# Patient Record
Sex: Male | Born: 1980 | Race: Black or African American | Hispanic: No | Marital: Single | Smoking: Former smoker
Health system: Southern US, Community
[De-identification: ages and names within clinical notes are randomized; demographics above are authoritative.]

## PROBLEM LIST (undated history)

## (undated) DIAGNOSIS — J3089 Other allergic rhinitis: Secondary | ICD-10-CM

## (undated) DIAGNOSIS — J45909 Unspecified asthma, uncomplicated: Secondary | ICD-10-CM

## (undated) DIAGNOSIS — J40 Bronchitis, not specified as acute or chronic: Secondary | ICD-10-CM

---

## 2007-08-04 ENCOUNTER — Emergency Department (HOSPITAL_COMMUNITY): Admission: EM | Admit: 2007-08-04 | Discharge: 2007-08-05 | Payer: Self-pay | Admitting: Emergency Medicine

## 2008-02-01 ENCOUNTER — Inpatient Hospital Stay (HOSPITAL_COMMUNITY): Admission: EM | Admit: 2008-02-01 | Discharge: 2008-02-04 | Payer: Self-pay | Admitting: Emergency Medicine

## 2009-01-06 ENCOUNTER — Emergency Department (HOSPITAL_BASED_OUTPATIENT_CLINIC_OR_DEPARTMENT_OTHER): Admission: EM | Admit: 2009-01-06 | Discharge: 2009-01-06 | Payer: Self-pay | Admitting: Emergency Medicine

## 2009-01-06 ENCOUNTER — Ambulatory Visit: Payer: Self-pay | Admitting: Diagnostic Radiology

## 2011-02-25 NOTE — H&P (Signed)
NAMEAUDWIN, SEMPER NO.:  [id number]   MEDICAL RECORD NO.:  [id number]          PATIENT TYPE:  INP   LOCATION:  0103                         FACILITY:  [facility]   PHYSICIAN:  Lucita Ferrara, MD         DATE OF BIRTH:  09/27/1981   DATE OF ADMISSION:  01/31/2008  DATE OF DISCHARGE:                              HISTORY & PHYSICAL   The patient is a 30 year old male with known childhood asthma who  presents to [facility] with chief complaint of  cough, wheezing, and shortness of breath that has been ongoing now for  three days.  The patient states that current symptoms did not respond to  his routine meter dose inhaler- albuterol.  Note, that he does not have  a primary care doctor and also he is not on any controller medications.  He has never been intubated and never seen a pulmonologist in regards to  his asthma.  He denies a history of rash or allergies.  Otherwise, he  denies any fevers or chills.  Cough is nonproductive.  He does smoke  less than 1/2 pack per day.  He also smokes marijuana.  He denies any  other drugs besides marijuana.   ALLERGIES:  No known drug allergies.   HOME MEDICATIONS:  Albuterol meter dose inhaler.   REVIEW OF SYSTEMS:  Otherwise negative.   FAMILY HISTORY:  Significant for diabetes.  Otherwise noncontributory.   PAST SURGICAL HISTORY:  None.   PHYSICAL EXAMINATION:  GENERAL:  The patient is in no acute respiratory  distress upon rest while lying in bed, but upon ambulation the patient  drops his pulse oximetry below 90%.  VITAL SIGNS:  Temperature 101, blood pressure 115/63, pulse 114,  respirations 22, pulse oximetry 85% on ambulation and 92% on 2 liters  nasal cannula.  HEENT:  Normocephalic and atraumatic.  Sclerae anicteric.  NECK:  Supple.  No JVD and no carotid bruits.  PERRLA.  Extraocular  muscles intact.  CARDIOVASCULAR:  S1 and S2 tachy.  Regular rhythm.  No murmurs, rubs, or  clicks.  ABDOMEN:   Soft, nontender, and nondistended.  Positive bowel sounds.  LUNGS:  Bilateral expiratory wheezes in all lung fields.  No rhonchi or  rales.  NEUROLOGY:  The patient is alert and oriented x3.  Cranial nerves II-XII  grossly intact.  EXTREMITIES:  No cyanosis, clubbing, or edema.   Chest x-ray shows mild peribronchial thickening without any focal air  space disease.   LABORATORY DATA:  White count 13.1, hemoglobin 16.3, hematocrit 47.4,  neutrophils 95.  Sodium 138, potassium 3.9, chloride 101, BUN 10,  creatinine 1.07, glucose 167.   ASSESSMENT:  A 30 year old with known childhood asthma, an active  smoker, here with cough/wheeze/shortness of breath not relieved with  outpatient meter dose inhaler or emergency room treatment with  prednisone and high flow nebulizers.  The patient is still dropping his  pulse oximetry upon ambulation to 88%.  The patient also has no PMD and  is currently on no controller medications.  He is an active smoker.   PLAN:  We will go ahead and admit  the patient to the medical telemetry  unit.  Start Solu-Medrol 80 mg IV q.8 hours followed by prednisone  taper.  Given that the patient also had mild fevers and mild  leukocytosis, we will empirically treat the patient with Levaquin 500 mg  IV daily, sputum cultures.   We will initiate controller medications with Flovent 88 mcg daily and  also Singulair 10 mg p.o. q.h.s.  Smoking cessation will be heavily  emphasized.  The patient will be given a nicotine patch.  We will  emphasize that smoking marijuana may also worsen asthma exacerbation.   Care management needs to set up PMD and perhaps referral to Health  Service.   Plans and procedures of this admission explained to the patient.  The  patient understands.  Time spend >45 minutes.      Lucita Ferrara, MD  Electronically Signed     RR/MEDQ  D:  02/01/2008  T:  02/01/2008  Job:  [id number]

## 2011-02-25 NOTE — Discharge Summary (Signed)
[name], [name]       ACCOUNT NO.:  [id number]   MEDICAL RECORD NO.:  [id number]          PATIENT TYPE:  INP   LOCATION:  1437                         FACILITY:  [facility]   PHYSICIAN:  Herbie Saxon, MDDATE OF BIRTH:  03/03/1981   DATE OF ADMISSION:  01/31/2008  DATE OF DISCHARGE:  02/04/2008                               DISCHARGE SUMMARY   h/ISCHARGE DIAGNOSES/>  1. Bronchial asthma exacerbation, improved,  2. Tachycardia, resolved  3. Chronic bronchitis, improved.  4. Gram-negative sepsis.  5. Positive blood culture also showing gram-positive cocci.  6. Substance abuse, tobacco and marijuana.  7. Conjunctivitis, improved.   RADIOLOGY:  Chest x-ray of January 31, 2008, shows mild peribronchial  thickening without focal airspace disease.   HOSPITAL COURSE:  This 30 year old African-American male with h/o  b/asthma, presented with increasing cough, wheezing, and shortness of  breath of 3 days' duration.  The patient smokes a pack per day, also  smokes marijuana occasionally.  The patient on this admission was  started on albuterol inhaler, also started on Flovent with Levaquin 500  mg IV daily.  His sputum culture was positive for polymorphic nuclear  cells, gram-positive cocci, and gram-negative rods.  The patient was  clinically improved on bronchodilators, oxygen supplementally, and he  had TobraDex eye drops added to his medication regimen as well as  noticing to be having bilateral redness of the eyes.  This is also  clinically improved.  The patient has been counseled extensively on the  need to abstain from smoking either tobacco or marijuana, as this would  exacerbate his lung symptoms.  Leukocytosis is resolved.  Note the  patient had vancomycin added to his medications empirically.  His trach  condition is stable.   DIET:  Regular.   ACTIVITY:  No restrictions, increase as tolerated.   FOLLOW UP:  At the office of primary care physician in next 5-7  days.   MEDICATIONS ON DISCHARGE:  1. Levaquin 72 mg daily x5 days.  2. Doxycycline 100 mg b.i.d. x5 days.  3. TobraDex 2 drops q.6h. bilateral eyes.  4. Prednisone 30 mg daily x4 days, 20 mg daily x4 days, 10 mg daily x4      days, 5 mg daily x2 days.  5. Albuterol inhaler 2 puffs q.6h. p.r.n.  6. Singulair 10 mg daily.  7. Advair 250/50, 2 puffs b.i.d.   EXAMINATION:  He is a young man not in acute distress.  Temperature 98, pulse 80, respiratory 20, blood pressure 106/63.  Pupils equal, reactive to light and accommodation.  Mucous membranes are  moist.  Oropharynx clear.  NECK:  Supple.  There is no mandibular lymphadenopathy.  No elevated JVD  or carotid bruit.  HEART:  Heartsounds 1 and 2 regular rate and rhythm, a few bilateral  expiratory rhonchi.  ABDOMEN:  Soft, nontender, no organomegaly.  Inguinal orifices are open.  NEURO:  Cranial nerves 2-12 intact.  EXTREMITIES:  Peripheral pulses present, no pedal edema.   LABS:  The glucose is 96.  WBC is 9; hematocrit is 42; platelet count is  193.  Chemistries:  Sodium 135, potassium 4.12, chloride 100,  bicarbonate  26, BUN 11, creatinine 1.1.  Sputum culture of February 02, 2008:  Few WBC, few gram-negative rods.   Need for followup and compliance with medication explained to the  patient.  Social work has also been involved as he has financial  difficulties in getting his medication.  Discharge time 30 minutes.      Herbie Saxon, MD  Electronically Signed     MIO/MEDQ  D:  02/04/2008  T:  02/04/2008  Job:  [id number]

## 2011-07-08 LAB — CBC
HCT: 42.5
HCT: 45.4
Hemoglobin: 14.4
Hemoglobin: 15.7
MCHC: 34.1
MCV: 92.4
MCV: 92.7
RBC: 4.59
RBC: 4.59
RBC: 4.69
RBC: 4.98
RBC: 5.13
RDW: 12.9
WBC: 12.5 — ABNORMAL HIGH
WBC: 13.1 — ABNORMAL HIGH
WBC: 14.3 — ABNORMAL HIGH
WBC: 9.5

## 2011-07-08 LAB — BASIC METABOLIC PANEL
Calcium: 9.7
Chloride: 101
Creatinine, Ser: 1.07
GFR calc Af Amer: >60
Sodium: 138

## 2011-07-08 LAB — DIFFERENTIAL
Eosinophils Absolute: 0.1
Eosinophils Relative: 1
Lymphs Abs: 0.4 — ABNORMAL LOW
Lymphs Abs: 2.2
Monocytes Absolute: 0.5
Monocytes Relative: 1 — ABNORMAL LOW
Monocytes Relative: 6
Neutro Abs: 12.5 — ABNORMAL HIGH
Neutrophils Relative %: 95 — ABNORMAL HIGH

## 2011-07-08 LAB — RAPID URINE DRUG SCREEN, HOSP PERFORMED
Opiates: POSITIVE — AB
Tetrahydrocannabinol: NOT DETECTED

## 2011-07-08 LAB — COMPREHENSIVE METABOLIC PANEL
Albumin: 4
Alkaline Phosphatase: 64
BUN: 11
Chloride: 100
Glucose, Bld: 229 — ABNORMAL HIGH
Potassium: 4.1
Total Bilirubin: 0.8

## 2011-07-08 LAB — CULTURE, RESPIRATORY W GRAM STAIN: Culture: NORMAL

## 2011-07-08 LAB — PHOSPHORUS: Phosphorus: 2.5

## 2017-08-10 ENCOUNTER — Emergency Department (HOSPITAL_COMMUNITY): Payer: Self-pay

## 2017-08-10 ENCOUNTER — Encounter (HOSPITAL_COMMUNITY): Payer: Self-pay

## 2017-08-10 ENCOUNTER — Emergency Department (HOSPITAL_COMMUNITY)
Admission: EM | Admit: 2017-08-10 | Discharge: 2017-08-10 | Disposition: A | Discharge status: Home / Self Care | Payer: Self-pay | Attending: Emergency Medicine | Admitting: Emergency Medicine

## 2017-08-10 DIAGNOSIS — J45909 Unspecified asthma, uncomplicated: Secondary | ICD-10-CM | POA: Insufficient documentation

## 2017-08-10 DIAGNOSIS — M25511 Pain in right shoulder: Secondary | ICD-10-CM | POA: Insufficient documentation

## 2017-08-10 DIAGNOSIS — F17200 Nicotine dependence, unspecified, uncomplicated: Secondary | ICD-10-CM | POA: Insufficient documentation

## 2017-08-10 HISTORY — DX: Unspecified asthma, uncomplicated: J45.909

## 2017-08-10 HISTORY — DX: Bronchitis, not specified as acute or chronic: J40

## 2017-08-10 MED ORDER — NAPROXEN 500 MG PO TABS
500.0000 mg | ORAL_TABLET | Freq: Two times a day (BID) | ORAL | 0 refills | Status: DC
Start: 2017-08-10 — End: 2017-10-09
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
## 2017-08-10 NOTE — Discharge Instructions (Signed)
You have been seen today for a shoulder injury. There were no acute abnormalities on the x-rays, including no sign of fracture or dislocation. Pain: Take 600 mg of ibuprofen every 6 hours or 440 mg (over the counter dose) to 500 mg (prescription dose) of naproxen every 12 hours for the next 3 days. After this time, these medications may be used as needed for pain. Take these medications with food to avoid upset stomach. Choose only one of these medications, do not take them together.  Tylenol: Should you continue to have additional pain while taking the ibuprofen or naproxen, you may add in tylenol as needed. Your daily total maximum amount of tylenol from all sources should be limited to 4000mg /day for persons without liver problems, or 2000mg /day for those with liver problems. Ice: May apply ice to the area over the next 24 hours for 15 minutes at a time to reduce swelling. Elevation: Keep the extremity elevated as often as possible to reduce pain and inflammation. Exercises: Start by performing these exercises a few times a week, increasing the frequency until you are performing them twice daily.  Follow up: If symptoms are improving, you may follow up with your primary care provider for any continued management. If symptoms are not improving, you may follow up with the orthopedic specialist.

## 2017-10-09 ENCOUNTER — Emergency Department (HOSPITAL_BASED_OUTPATIENT_CLINIC_OR_DEPARTMENT_OTHER)
Admission: EM | Admit: 2017-10-09 | Discharge: 2017-10-09 | Disposition: A | Discharge status: Home / Self Care | Payer: Self-pay | Attending: Emergency Medicine | Admitting: Emergency Medicine

## 2017-10-09 ENCOUNTER — Encounter (HOSPITAL_COMMUNITY): Payer: Self-pay | Admitting: Emergency Medicine

## 2017-10-09 ENCOUNTER — Other Ambulatory Visit: Payer: Self-pay

## 2017-10-09 ENCOUNTER — Emergency Department (HOSPITAL_COMMUNITY)
Admission: EM | Admit: 2017-10-09 | Discharge: 2017-10-09 | Disposition: A | Discharge status: Home / Self Care | Payer: Self-pay | Attending: Emergency Medicine | Admitting: Emergency Medicine

## 2017-10-09 ENCOUNTER — Emergency Department (HOSPITAL_COMMUNITY): Payer: Self-pay

## 2017-10-09 ENCOUNTER — Encounter (HOSPITAL_BASED_OUTPATIENT_CLINIC_OR_DEPARTMENT_OTHER): Payer: Self-pay | Admitting: Emergency Medicine

## 2017-10-09 DIAGNOSIS — R05 Cough: Secondary | ICD-10-CM | POA: Insufficient documentation

## 2017-10-09 DIAGNOSIS — R509 Fever, unspecified: Secondary | ICD-10-CM | POA: Insufficient documentation

## 2017-10-09 DIAGNOSIS — M791 Myalgia, unspecified site: Secondary | ICD-10-CM | POA: Insufficient documentation

## 2017-10-09 DIAGNOSIS — Z5321 Procedure and treatment not carried out due to patient leaving prior to being seen by health care provider: Secondary | ICD-10-CM | POA: Insufficient documentation

## 2017-10-09 LAB — COMPREHENSIVE METABOLIC PANEL
ALK PHOS: 62 U/L (ref 38–126)
ALT: 42 U/L (ref 17–63)
AST: 40 U/L (ref 15–41)
Albumin: 4.7 g/dL (ref 3.5–5.0)
Anion gap: 10 (ref 5–15)
BILIRUBIN TOTAL: 0.8 mg/dL (ref 0.3–1.2)
BUN: 13 mg/dL (ref 6–20)
CALCIUM: 9.8 mg/dL (ref 8.9–10.3)
CO2: 25 mmol/L (ref 22–32)
CREATININE: 1.27 mg/dL — AB (ref 0.61–1.24)
Chloride: 99 mmol/L — ABNORMAL LOW (ref 101–111)
Glucose, Bld: 101 mg/dL — ABNORMAL HIGH (ref 65–99)
Potassium: 3.6 mmol/L (ref 3.5–5.1)
Sodium: 134 mmol/L — ABNORMAL LOW (ref 135–145)
TOTAL PROTEIN: 8.1 g/dL (ref 6.5–8.1)

## 2017-10-09 LAB — PROTIME-INR
INR: 1.01
PROTHROMBIN TIME: 13.2 s (ref 11.4–15.2)

## 2017-10-09 LAB — CBC WITH DIFFERENTIAL/PLATELET
BASOS PCT: 0 %
Basophils Absolute: 0 10*3/uL (ref 0.0–0.1)
EOS ABS: 0 10*3/uL (ref 0.0–0.7)
Eosinophils Relative: 0 %
HCT: 49.7 % (ref 39.0–52.0)
HEMOGLOBIN: 17 g/dL (ref 13.0–17.0)
Lymphocytes Relative: 11 %
Lymphs Abs: 0.7 10*3/uL (ref 0.7–4.0)
MCH: 31.9 pg (ref 26.0–34.0)
MCHC: 34.2 g/dL (ref 30.0–36.0)
MCV: 93.2 fL (ref 78.0–100.0)
MONO ABS: 0.8 10*3/uL (ref 0.1–1.0)
MONOS PCT: 14 %
NEUTROS PCT: 75 %
Neutro Abs: 4.3 10*3/uL (ref 1.7–7.7)
PLATELETS: 172 10*3/uL (ref 150–400)
RBC: 5.33 MIL/uL (ref 4.22–5.81)
RDW: 12.7 % (ref 11.5–15.5)
WBC: 5.8 10*3/uL (ref 4.0–10.5)

## 2017-10-09 LAB — I-STAT CG4 LACTIC ACID, ED: Lactic Acid, Venous: 1.03 mmol/L (ref 0.5–1.9)

## 2017-10-09 MED ORDER — ACETAMINOPHEN 325 MG PO TABS: 650.0000 mg | ORAL_TABLET | Freq: Once | ORAL | Status: DC | PRN

## 2017-10-09 NOTE — ED Notes (Signed)
While triaging the patient the patient got a phone call and states that he has to leave because of an "emergency" - patient made aware that he is able to come back at any time. The patient advised that he has fever

## 2017-10-09 NOTE — ED Triage Notes (Signed)
Pt. Stated, Benjamin Atlasve got body aches, chills and a fever with a cough that started yesterday.

## 2017-10-09 NOTE — ED Triage Notes (Signed)
Patient states that when he coughs his chest and his head hurts. THe [facility]

## 2017-10-14 ENCOUNTER — Emergency Department (HOSPITAL_BASED_OUTPATIENT_CLINIC_OR_DEPARTMENT_OTHER): Payer: Self-pay

## 2017-10-14 ENCOUNTER — Encounter (HOSPITAL_BASED_OUTPATIENT_CLINIC_OR_DEPARTMENT_OTHER): Payer: Self-pay | Admitting: *Deleted

## 2017-10-14 ENCOUNTER — Emergency Department (HOSPITAL_BASED_OUTPATIENT_CLINIC_OR_DEPARTMENT_OTHER)
Admission: EM | Admit: 2017-10-14 | Discharge: 2017-10-14 | Disposition: A | Discharge status: Home / Self Care | Payer: Self-pay | Attending: Emergency Medicine | Admitting: Emergency Medicine

## 2017-10-14 ENCOUNTER — Other Ambulatory Visit: Payer: Self-pay

## 2017-10-14 DIAGNOSIS — J189 Pneumonia, unspecified organism: Secondary | ICD-10-CM | POA: Insufficient documentation

## 2017-10-14 DIAGNOSIS — F17210 Nicotine dependence, cigarettes, uncomplicated: Secondary | ICD-10-CM | POA: Insufficient documentation

## 2017-10-14 DIAGNOSIS — J45909 Unspecified asthma, uncomplicated: Secondary | ICD-10-CM | POA: Insufficient documentation

## 2017-10-14 LAB — CULTURE, BLOOD (ROUTINE X 2)
CULTURE: NO GROWTH
Culture: NO GROWTH
SPECIAL REQUESTS: ADEQUATE
Special Requests: ADEQUATE

## 2017-10-14 MED ORDER — BENZONATATE 100 MG PO CAPS: 100.0000 mg | ORAL_CAPSULE | Freq: Three times a day (TID) | ORAL | 0 refills | Status: DC

## 2017-10-14 MED ORDER — AZITHROMYCIN 250 MG PO TABS: ORAL_TABLET | ORAL | 0 refills | Status: DC

## 2017-10-14 MED ORDER — PREDNISONE 20 MG PO TABS: ORAL_TABLET | ORAL | 0 refills | Status: DC

## 2017-10-14 MED FILL — AZITHROMYCIN 250 MG TABLET: 250 | 5 days supply | Qty: 6 | Fill #0

## 2017-10-14 MED FILL — BENZONATATE 100 MG CAPSULE: 100 | 7 days supply | Qty: 21 | Fill #0

## 2017-10-14 MED FILL — predniSONE 20 MG TABS: 20 | 5 days supply | Qty: 11 | Fill #0

## 2017-10-14 NOTE — ED Triage Notes (Signed)
Pt reports one week of cough, congestion, checked in here on Sunday but couldn't wait to be seen due to other obligations. Cough and congestion continues today.

## 2017-10-14 NOTE — ED Notes (Signed)
Pt and family understood dc material. NAD noted. Scripts given at dc 
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
## 2017-10-14 NOTE — ED Triage Notes (Signed)
Pt states he is coughing up dark yellow sputum with "some little spots of blood sometimes."

## 2017-12-28 ENCOUNTER — Emergency Department (HOSPITAL_BASED_OUTPATIENT_CLINIC_OR_DEPARTMENT_OTHER): Payer: Self-pay

## 2017-12-28 ENCOUNTER — Other Ambulatory Visit: Payer: Self-pay

## 2017-12-28 ENCOUNTER — Emergency Department (HOSPITAL_BASED_OUTPATIENT_CLINIC_OR_DEPARTMENT_OTHER)
Admission: EM | Admit: 2017-12-28 | Discharge: 2017-12-28 | Disposition: A | Discharge status: Home / Self Care | Payer: Self-pay | Attending: Emergency Medicine | Admitting: Emergency Medicine

## 2017-12-28 ENCOUNTER — Encounter (HOSPITAL_BASED_OUTPATIENT_CLINIC_OR_DEPARTMENT_OTHER): Payer: Self-pay | Admitting: Emergency Medicine

## 2017-12-28 DIAGNOSIS — Z79899 Other long term (current) drug therapy: Secondary | ICD-10-CM | POA: Insufficient documentation

## 2017-12-28 DIAGNOSIS — F17200 Nicotine dependence, unspecified, uncomplicated: Secondary | ICD-10-CM | POA: Insufficient documentation

## 2017-12-28 DIAGNOSIS — R6889 Other general symptoms and signs: Secondary | ICD-10-CM

## 2017-12-28 DIAGNOSIS — J45909 Unspecified asthma, uncomplicated: Secondary | ICD-10-CM | POA: Insufficient documentation

## 2017-12-28 DIAGNOSIS — J111 Influenza due to unidentified influenza virus with other respiratory manifestations: Secondary | ICD-10-CM | POA: Insufficient documentation

## 2017-12-28 LAB — RAPID STREP SCREEN (MED CTR MEBANE ONLY): Streptococcus, Group A Screen (Direct): NEGATIVE

## 2017-12-28 MED ORDER — LIDOCAINE VISCOUS 2 % MT SOLN: 20.0000 mL | OROMUCOSAL | 0 refills | Status: AC | PRN

## 2017-12-28 MED ORDER — IPRATROPIUM-ALBUTEROL 0.5-2.5 (3) MG/3ML IN SOLN
3.0000 mL | Freq: Once | RESPIRATORY_TRACT | Status: AC
  Administered 2017-12-28: 3 mL via RESPIRATORY_TRACT
  Filled 2017-12-28: qty 3

## 2017-12-28 MED ORDER — BENZONATATE 100 MG PO CAPS: 100.0000 mg | ORAL_CAPSULE | Freq: Three times a day (TID) | ORAL | 0 refills | Status: DC

## 2017-12-28 MED ORDER — ALBUTEROL SULFATE HFA 108 (90 BASE) MCG/ACT IN AERS: 1.0000 | INHALATION_SPRAY | Freq: Four times a day (QID) | RESPIRATORY_TRACT | 0 refills | Status: DC | PRN

## 2017-12-28 MED ORDER — BENZONATATE 100 MG PO CAPS
200.0000 mg | ORAL_CAPSULE | Freq: Once | ORAL | Status: AC
  Administered 2017-12-28: 200 mg via ORAL
  Filled 2017-12-28: qty 2

## 2017-12-28 MED ORDER — DEXAMETHASONE SODIUM PHOSPHATE 10 MG/ML IJ SOLN
10.0000 mg | Freq: Once | INTRAMUSCULAR | Status: AC
  Administered 2017-12-28: 10 mg
  Filled 2017-12-28: qty 1

## 2017-12-28 MED FILL — ALBUTEROL SULFATE HFA 108 (: 108 (90 BAS | 25 days supply | Qty: 18 | Fill #0

## 2017-12-28 MED FILL — LIDOCAINE 2% VISCOUS SOLN: 2 | 5 days supply | Qty: 100 | Fill #0

## 2017-12-28 MED FILL — BENZONATATE 100 MG CAPSULE: 100 | 5 days supply | Qty: 15 | Fill #0

## 2017-12-28 NOTE — ED Notes (Signed)
Pt verbalizes understanding of d/c instructions and denies any further needs at this time. 

## 2017-12-28 NOTE — ED Triage Notes (Signed)
Reports sore throat x 3 days.  Also reports congestion, cough.  Denies fevers.

## 2017-12-28 NOTE — Discharge Instructions (Signed)
Your strep test was negative.  Your chest x-ray did not show any signs of pneumonia.  This is likely a viral illness.  Have given you Tessalon for cough.  Have also given you viscous lidocaine to help with your sore throat.  Use the albuterol inhaler for any wheezing or cough that she may have.  Continue using over-the-counter decongestants.  Motrin and Tylenol for fevers and body aches.  Follow-up with your primary care doctor return the ED with any worsening symptoms.

## 2017-12-29 NOTE — ED Provider Notes (Addendum)
[facility] EMERGENCY DEPARTMENT Provider Note   CSN: [id number] Arrival date & time: 12/28/17  1530     History   Chief Complaint Chief Complaint  Patient presents with  . Sore Throat    HPI Benjamin [name] is a 37 y.o. male.  HPI 37 year old African-American male with no pertinent past medical history presents to the emergency department today for evaluation of sore throat, cough, congestion, rhinorrhea.  Denies fevers but reports some chills.  Patient states that his symptoms started 3 days ago.  Complains of some pain with swallowing.  Denies any associated chest pain or shortness of breath.  Patient does endorse wheezing at baseline due to smoking.  Patient has history of bronchitis and asthma.  Denies any associated sick contacts.  Did not receive a flu vaccine this year.  Denies any associated nausea, vomiting, diarrhea. Past Medical History:  Diagnosis Date  . Asthma   . Bronchitis     There are no active problems to display for this patient.   History reviewed. No pertinent surgical history.     Home Medications    Prior to Admission medications   Medication Sig Start Date End Date Taking? Authorizing Provider  albuterol (PROVENTIL HFA;VENTOLIN HFA) 108 (90 Base) MCG/ACT inhaler Inhale 1-2 puffs into the lungs every 6 (six) hours as needed for wheezing or shortness of breath. 12/28/17   Rise Mu, PA-C  azithromycin (ZITHROMAX Z-PAK) 250 MG tablet 2 po day one, then 1 daily x 4 days 10/14/17   Fayrene Helper, PA-C  benzonatate (TESSALON) 100 MG capsule Take 1 capsule (100 mg total) by mouth every 8 (eight) hours. 12/28/17   Demetrios Loll T, PA-C  lidocaine (XYLOCAINE) 2 % solution Use as directed 20 mLs in the mouth or throat as needed for mouth pain. 12/28/17   Rise Mu, PA-C  predniSONE (DELTASONE) 20 MG tablet 3 tabs po day one, then 2 tabs daily x 4 days 10/14/17   Fayrene Helper, PA-C    Family History History reviewed. No  pertinent family history.  Social History Social History   Tobacco Use  . Smoking status: Current Some Day Smoker    Packs/day: 0.50  . Smokeless tobacco: Never Used  Substance Use Topics  . Alcohol use: Yes  . Drug use: Yes    Types: Marijuana     Allergies   Patient has no known allergies.   Review of Systems Review of Systems  All other systems reviewed and are negative.    Physical Exam Updated Vital Signs BP 111/80   Pulse (!) 105   Temp 98 F (36.7 C) (Oral)   Resp 18   Ht 5\' 3"  (1.6 m)   Wt 83.9 kg (185 lb)   SpO2 95%   BMI 32.77 kg/m   Physical Exam  Constitutional: He appears well-developed and well-nourished. No distress.  HENT:  Head: Normocephalic and atraumatic.  Right Ear: Tympanic membrane and ear canal normal. No drainage.  Left Ear: Tympanic membrane and ear canal normal. No drainage.  Mouth/Throat: Uvula is midline and mucous membranes are normal. No oral lesions. No uvula swelling. Posterior oropharyngeal erythema present. No oropharyngeal exudate, posterior oropharyngeal edema or tonsillar abscesses. Tonsils are 1+ on the right. Tonsils are 1+ on the left. No tonsillar exudate.  Eyes: Right eye exhibits no discharge. Left eye exhibits no discharge. No scleral icterus.  Neck: Normal range of motion. Neck supple.  Cardiovascular: Normal rate.  Pulmonary/Chest: Effort normal. No stridor. No respiratory distress.  He has wheezes (expiratory). He has no rhonchi. He has no rales. He exhibits no tenderness.  Musculoskeletal: Normal range of motion.  Lymphadenopathy:    He has no cervical adenopathy.  Neurological: He is alert.  Skin: Skin is warm and dry. Capillary refill takes less than 2 seconds. No pallor.  Psychiatric: His behavior is normal. Judgment and thought content normal.  Nursing note and vitals reviewed.    ED Treatments / Results  Labs (all labs ordered are listed, but only abnormal results are displayed) Labs Reviewed  RAPID  STREP SCREEN (NOT AT [facility])  CULTURE, GROUP A STREP [facility])    EKG  EKG Interpretation None       Radiology Dg Chest 2 View  Result Date: 12/28/2017 CLINICAL DATA:  37 year old male with sore throat for 3 days. Cough and congestion. EXAM: CHEST - 2 VIEW COMPARISON:  10/14/2017 and earlier. FINDINGS: Base in January has largely mild streaky opacity seen at the left lung resolved. There is minimal linear opacity in the left mid lung which most resembles atelectasis. Lung volumes remain within normal limits. Mild eventration of the diaphragm (normal variant). No pneumothorax, pulmonary edema, pleural effusion or other confluent opacity. Mediastinal contours remain normal. Visualized tracheal air column is within normal limits. No acute osseous abnormality identified. Negative visible bowel gas pattern. IMPRESSION: Improved left lung base ventilation since January. Minor atelectasis, no other acute cardiopulmonary abnormality. Electronically Signed   By: Odessa Fleming M.D.   On: 12/28/2017 16:21    Procedures Procedures (including critical care time)  Medications Ordered in ED Medications  ipratropium-albuterol (DUONEB) 0.5-2.5 (3) MG/3ML nebulizer solution 3 mL (3 mLs Nebulization Given 12/28/17 1635)  benzonatate (TESSALON) capsule 200 mg (200 mg Oral Given 12/28/17 1623)  dexamethasone (DECADRON) injection 10 mg (10 mg Other Given 12/28/17 1624)     Initial Impression / Assessment and Plan / ED Course  I have reviewed the triage vital signs and the nursing notes.  Pertinent labs & imaging results that were available during my care of the patient were reviewed by me and considered in my medical decision making (see chart for details).     Pt CXR negative for acute infiltrate. Patients symptoms are consistent with URI, likely viral etiology. Discussed that antibiotics are not indicated for viral infections. Pt afebrile without tonsillar exudate, negative strep. Presents with mild cervical  lymphadenopathy, & dysphagia; diagnosis of viral pharyngitis. No abx indicated. DC w symptomatic tx for pain  Pt does not appear dehydrated, but did discuss importance of water rehydration. Presentation non concerning for PTA or infxn spread to soft tissue. No trismus or uvula deviation. Specific return precautions discussed. Pt able to drink water in ED without difficulty with intact air way. Recommended PCP follow up.   Pt is hemodynamically stable, in NAD, & able to ambulate in the ED. Evaluation does not show pathology that would require ongoing emergent intervention or inpatient treatment. I explained the diagnosis to the patient. Pain has been managed & has no complaints prior to dc. Pt is comfortable with above plan and is stable for discharge at this time. All questions were answered prior to disposition. Strict return precautions for f/u to the ED were discussed. Encouraged follow up with PCP.    Final Clinical Impressions(s) / ED Diagnoses   Final diagnoses:  Flu-like symptoms    ED Discharge Orders        Ordered    lidocaine (XYLOCAINE) 2 % solution  As needed  12/28/17 1639    benzonatate (TESSALON) 100 MG capsule  Every 8 hours     12/28/17 1639    albuterol (PROVENTIL HFA;VENTOLIN HFA) 108 (90 Base) MCG/ACT inhaler  Every 6 hours PRN     12/28/17 1639       Rise MuLeaphart, Nils Thor T, PA-C 12/29/17 1551    Rise MuLeaphart, Latoia Eyster T, PA-C 12/29/17 1552    Doug SouJacubowitz, Sam, MD 12/30/17 [phone]

## 2017-12-31 LAB — CULTURE, GROUP A STREP (THRC)

## 2018-04-28 ENCOUNTER — Emergency Department (HOSPITAL_BASED_OUTPATIENT_CLINIC_OR_DEPARTMENT_OTHER)
Admission: EM | Admit: 2018-04-28 | Discharge: 2018-04-28 | Disposition: A | Discharge status: Home / Self Care | Payer: Self-pay | Attending: Emergency Medicine | Admitting: Emergency Medicine

## 2018-04-28 ENCOUNTER — Emergency Department (HOSPITAL_BASED_OUTPATIENT_CLINIC_OR_DEPARTMENT_OTHER): Payer: Self-pay

## 2018-04-28 ENCOUNTER — Other Ambulatory Visit: Payer: Self-pay

## 2018-04-28 ENCOUNTER — Encounter (HOSPITAL_BASED_OUTPATIENT_CLINIC_OR_DEPARTMENT_OTHER): Payer: Self-pay | Admitting: Emergency Medicine

## 2018-04-28 DIAGNOSIS — F17200 Nicotine dependence, unspecified, uncomplicated: Secondary | ICD-10-CM | POA: Insufficient documentation

## 2018-04-28 DIAGNOSIS — J4541 Moderate persistent asthma with (acute) exacerbation: Secondary | ICD-10-CM | POA: Insufficient documentation

## 2018-04-28 MED ORDER — ALBUTEROL SULFATE (2.5 MG/3ML) 0.083% IN NEBU: 5.0000 mg | INHALATION_SOLUTION | Freq: Once | RESPIRATORY_TRACT | Status: DC

## 2018-04-28 MED ORDER — ALBUTEROL SULFATE (2.5 MG/3ML) 0.083% IN NEBU
2.5000 mg | INHALATION_SOLUTION | Freq: Once | RESPIRATORY_TRACT | Status: AC
  Administered 2018-04-28: 2.5 mg via RESPIRATORY_TRACT
  Filled 2018-04-28: qty 3

## 2018-04-28 MED ORDER — ALBUTEROL SULFATE HFA 108 (90 BASE) MCG/ACT IN AERS: 1.0000 | INHALATION_SPRAY | Freq: Four times a day (QID) | RESPIRATORY_TRACT | 0 refills | Status: DC | PRN

## 2018-04-28 MED ORDER — PREDNISONE 10 MG PO TABS: 20.0000 mg | ORAL_TABLET | Freq: Every day | ORAL | 0 refills | Status: DC

## 2018-04-28 MED ORDER — IPRATROPIUM BROMIDE 0.02 % IN SOLN: 0.5000 mg | Freq: Once | RESPIRATORY_TRACT | Status: DC

## 2018-04-28 MED ORDER — IPRATROPIUM-ALBUTEROL 0.5-2.5 (3) MG/3ML IN SOLN
3.0000 mL | Freq: Once | RESPIRATORY_TRACT | Status: AC
  Administered 2018-04-28: 3 mL via RESPIRATORY_TRACT
  Filled 2018-04-28: qty 3

## 2018-04-28 MED ORDER — PREDNISONE 50 MG PO TABS
60.0000 mg | ORAL_TABLET | Freq: Once | ORAL | Status: AC
  Administered 2018-04-28: 60 mg via ORAL
  Filled 2018-04-28: qty 1

## 2018-04-28 NOTE — ED Notes (Signed)
Pt verbalized understanding of dc instructions.

## 2018-04-28 NOTE — ED Triage Notes (Signed)
Pt reports asthma sxs x 1 wk; also reports cold sxs w/ prod cough

## 2018-04-28 NOTE — ED Provider Notes (Signed)
[facility] EMERGENCY DEPARTMENT Provider Note   CSN: [id number] Arrival date & time: 04/28/18  1002     History   Chief Complaint Chief Complaint  Patient presents with  . Asthma    HPI Ogden Handlin is a 37 y.o. male.  HPI 37 year old male history of asthma presents emergency department complaints of cough and mild shortness of breath over the past week.  Is been trying his bronchodilators with some improvement.  Today symptoms persisted and he is having new productive cough and this gave him concerned.  His symptoms are mild to moderate in severity.  No orthopnea.  No unilateral leg swelling.  No history of DVT or pulmonary embolism.  No fevers.   Past Medical History:  Diagnosis Date  . Asthma   . Bronchitis     There are no active problems to display for this patient.   History reviewed. No pertinent surgical history.      Home Medications    Prior to Admission medications   Medication Sig Start Date End Date Taking? Authorizing Provider  albuterol (PROVENTIL HFA;VENTOLIN HFA) 108 (90 Base) MCG/ACT inhaler Inhale 1-2 puffs into the lungs every 6 (six) hours as needed for wheezing or shortness of breath. 04/28/18   Azalia Bilis, MD  lidocaine (XYLOCAINE) 2 % solution Use as directed 20 mLs in the mouth or throat as needed for mouth pain. 12/28/17   Rise Mu, PA-C  predniSONE (DELTASONE) 10 MG tablet Take 2 tablets (20 mg total) by mouth daily. 04/28/18   Azalia Bilis, MD    Family History No family history on file.  Social History Social History   Tobacco Use  . Smoking status: Current Some Day Smoker    Packs/day: 0.50  . Smokeless tobacco: Never Used  Substance Use Topics  . Alcohol use: Yes  . Drug use: Yes    Types: Marijuana     Allergies   Patient has no known allergies.   Review of Systems Review of Systems  All other systems reviewed and are negative.    Physical Exam Updated Vital Signs BP 109/75    Pulse 88   Temp (!) 97.4 F (36.3 C) (Oral)   Resp 18   Ht 5\' 3"  (1.6 m)   Wt 96.2 kg (212 lb)   SpO2 100%   BMI 37.55 kg/m   Physical Exam  Constitutional: He is oriented to person, place, and time. He appears well-developed and well-nourished.  HENT:  Head: Normocephalic and atraumatic.  Eyes: EOM are normal.  Neck: Normal range of motion.  Cardiovascular: Normal rate, regular rhythm and normal heart sounds.  Pulmonary/Chest: Effort normal. No respiratory distress. He has wheezes.  Abdominal: Soft. He exhibits no distension. There is no tenderness.  Musculoskeletal: Normal range of motion.  Neurological: He is alert and oriented to person, place, and time.  Skin: Skin is warm and dry.  Psychiatric: He has a normal mood and affect. Judgment normal.  Nursing note and vitals reviewed.    ED Treatments / Results  Labs (all labs ordered are listed, but only abnormal results are displayed) Labs Reviewed - No data to display  EKG None  Radiology Dg Chest 2 View  Result Date: 04/28/2018 CLINICAL DATA:  Cough and shortness of breath for the past week. EXAM: CHEST - 2 VIEW COMPARISON:  Chest x-ray dated December 28, 2017. FINDINGS: The heart size and mediastinal contours are within normal limits. Both lungs are clear. The visualized skeletal structures are unremarkable. IMPRESSION:  No active cardiopulmonary disease. Electronically Signed   By: Obie DredgeWilliam T Derry M.D.   On: 04/28/2018 10:48    Procedures Procedures (including critical care time)  Medications Ordered in ED Medications  predniSONE (DELTASONE) tablet 60 mg (60 mg Oral Given 04/28/18 1040)  ipratropium-albuterol (DUONEB) 0.5-2.5 (3) MG/3ML nebulizer solution 3 mL (3 mLs Nebulization Given 04/28/18 1028)  albuterol (PROVENTIL) (2.5 MG/3ML) 0.083% nebulizer solution 2.5 mg (2.5 mg Nebulization Given 04/28/18 1028)     Initial Impression / Assessment and Plan / ED Course  I have reviewed the triage vital signs and the  nursing notes.  Pertinent labs & imaging results that were available during my care of the patient were reviewed by me and considered in my medical decision making (see chart for details).     Mild asthma exacerbation.  Feels better after bronchodilators.  Discharged home with steroids and every 4 hours bronchodilators.  Patient understands return to the ER for new or worsening symptoms  Final Clinical Impressions(s) / ED Diagnoses   Final diagnoses:  Moderate persistent asthma with exacerbation    ED Discharge Orders        Ordered    predniSONE (DELTASONE) 10 MG tablet  Daily     04/28/18 1122    albuterol (PROVENTIL HFA;VENTOLIN HFA) 108 (90 Base) MCG/ACT inhaler  Every 6 hours PRN     04/28/18 1122       Azalia Bilisampos, Kelis Plasse, MD 04/28/18 1124

## 2018-11-02 ENCOUNTER — Emergency Department (HOSPITAL_BASED_OUTPATIENT_CLINIC_OR_DEPARTMENT_OTHER)
Admission: EM | Admit: 2018-11-02 | Discharge: 2018-11-02 | Disposition: A | Discharge status: Home / Self Care | Payer: Self-pay | Attending: Emergency Medicine | Admitting: Emergency Medicine

## 2018-11-02 ENCOUNTER — Other Ambulatory Visit: Payer: Self-pay

## 2018-11-02 ENCOUNTER — Emergency Department (HOSPITAL_BASED_OUTPATIENT_CLINIC_OR_DEPARTMENT_OTHER): Payer: Self-pay

## 2018-11-02 ENCOUNTER — Encounter (HOSPITAL_BASED_OUTPATIENT_CLINIC_OR_DEPARTMENT_OTHER): Payer: Self-pay | Admitting: Emergency Medicine

## 2018-11-02 DIAGNOSIS — F17210 Nicotine dependence, cigarettes, uncomplicated: Secondary | ICD-10-CM | POA: Insufficient documentation

## 2018-11-02 DIAGNOSIS — R062 Wheezing: Secondary | ICD-10-CM | POA: Insufficient documentation

## 2018-11-02 DIAGNOSIS — Z79899 Other long term (current) drug therapy: Secondary | ICD-10-CM | POA: Insufficient documentation

## 2018-11-02 DIAGNOSIS — J111 Influenza due to unidentified influenza virus with other respiratory manifestations: Secondary | ICD-10-CM | POA: Insufficient documentation

## 2018-11-02 DIAGNOSIS — R69 Illness, unspecified: Secondary | ICD-10-CM

## 2018-11-02 MED ORDER — PREDNISONE 10 MG PO TABS: 40.0000 mg | ORAL_TABLET | Freq: Every day | ORAL | 0 refills | Status: AC

## 2018-11-02 MED ORDER — IPRATROPIUM-ALBUTEROL 0.5-2.5 (3) MG/3ML IN SOLN
3.0000 mL | Freq: Four times a day (QID) | RESPIRATORY_TRACT | Status: DC
  Administered 2018-11-02: 3 mL via RESPIRATORY_TRACT
  Filled 2018-11-02: qty 3

## 2018-11-02 MED ORDER — ALBUTEROL SULFATE (2.5 MG/3ML) 0.083% IN NEBU
2.5000 mg | INHALATION_SOLUTION | Freq: Once | RESPIRATORY_TRACT | Status: AC
  Administered 2018-11-02: 2.5 mg via RESPIRATORY_TRACT
  Filled 2018-11-02: qty 3

## 2018-11-02 MED ORDER — IPRATROPIUM-ALBUTEROL 0.5-2.5 (3) MG/3ML IN SOLN
3.0000 mL | Freq: Once | RESPIRATORY_TRACT | Status: AC
  Administered 2018-11-02: 3 mL via RESPIRATORY_TRACT
  Filled 2018-11-02: qty 3

## 2018-11-02 MED FILL — predniSONE 10 MG TABS: 10 | 5 days supply | Qty: 20 | Fill #0

## 2018-11-02 NOTE — Discharge Instructions (Signed)
Plenty water and get plenty of rest.  Take prednisone as prescribed.  Use your albuterol inhaler or nebulizer treatments as needed for shortness of breath.  Continue taking Mucinex for congestion.  You can take Tylenol as needed for aches pains or fever.   Followup with your primary care doctor in 3-5 days for recheck of ongoing symptoms. Return to emergency department for emergent changing or worsening of symptoms such as throat tightness, facial swelling, fever not controlled by Tylenol,difficulty breathing despite use of albuterol and prednisone, or chest pain.

## 2018-11-02 NOTE — ED Triage Notes (Signed)
Reports fever with congestion which began 2 days ago.  Reports productive cough and generalized body aches.

## 2018-11-02 NOTE — ED Notes (Addendum)
Ambulated with Pulse ox, endorses mild DOE, SpO2 91-94% on r/a, HR 110-118.

## 2018-11-02 NOTE — ED Provider Notes (Signed)
[facility] EMERGENCY DEPARTMENT Provider Note   CSN: [id number] Arrival date & time: 11/02/18  [id number]     History   Chief Complaint Chief Complaint  Patient presents with  . Fever    HPI Benjamin [name] is a 38 y.o. male with history of asthma presents for evaluation of acute onset, persistent cough for 4 days.  He reports myalgias, subjective fevers and chills, and cough productive of yellow sputum.  He does note some shortness of breath with activity which improves taking his albuterol inhaler and notes aching generalized chest pain with cough only.  Denies abdominal pain or vomiting.  No nasal congestion, sore throat, or headaches.  He has been resting with minimal improvement.  He is a current smoker of approximately 2 to 3 cigarettes daily.  He did receive a breathing treatment prior to my assessment with improvement.  The history is provided by the patient.    Past Medical History:  Diagnosis Date  . Asthma   . Bronchitis     There are no active problems to display for this patient.   History reviewed. No pertinent surgical history.      Home Medications    Prior to Admission medications   Medication Sig Start Date End Date Taking? Authorizing Provider  albuterol (PROVENTIL HFA;VENTOLIN HFA) 108 (90 Base) MCG/ACT inhaler Inhale 1-2 puffs into the lungs every 6 (six) hours as needed for wheezing or shortness of breath. 04/28/18   Azalia Bilisampos, Kevin, MD  lidocaine (XYLOCAINE) 2 % solution Use as directed 20 mLs in the mouth or throat as needed for mouth pain. 12/28/17   Rise MuLeaphart, Kenneth T, PA-C  predniSONE (DELTASONE) 10 MG tablet Take 4 tablets (40 mg total) by mouth daily with breakfast for 5 days. 11/02/18 11/07/18  Jeanie SewerFawze, Advith Martine A, PA-C    Family History History reviewed. No pertinent family history.  Social History Social History   Tobacco Use  . Smoking status: Current Some Day Smoker    Packs/day: 0.50  . Smokeless tobacco: Never Used  Substance  Use Topics  . Alcohol use: Yes  . Drug use: Yes    Types: Marijuana     Allergies   Patient has no known allergies.   Review of Systems Review of Systems  Constitutional: Positive for chills and fever.  HENT: Negative for congestion and sore throat.   Respiratory: Positive for cough and shortness of breath.   Cardiovascular: Positive for chest pain.  Gastrointestinal: Negative for abdominal pain and vomiting.  All other systems reviewed and are negative.    Physical Exam Updated Vital Signs BP (!) 138/93 (BP Location: Left Arm)   Pulse 97   Temp 99.1 F (37.3 C) (Oral)   Resp 16   Ht 5\' 3"  (1.6 m)   Wt 97.5 kg   SpO2 94%   BMI 38.09 kg/m   Physical Exam Vitals signs and nursing note reviewed.  Constitutional:      General: He is not in acute distress.    Appearance: He is well-developed.  HENT:     Head: Normocephalic and atraumatic.     Jaw: No trismus.     Right Ear: Tympanic membrane, ear canal and external ear normal.     Left Ear: Tympanic membrane, ear canal and external ear normal.     Nose: No congestion or rhinorrhea.     Right Sinus: No maxillary sinus tenderness or frontal sinus tenderness.     Left Sinus: No maxillary sinus tenderness or frontal sinus  tenderness.     Mouth/Throat:     Mouth: Mucous membranes are moist.     Pharynx: Oropharynx is clear. Uvula midline. No oropharyngeal exudate or posterior oropharyngeal erythema.     Tonsils: Swelling: 1+ on the right. 1+ on the left.  Eyes:     General:        Right eye: No discharge.        Left eye: No discharge.     Conjunctiva/sclera: Conjunctivae normal.  Neck:     Musculoskeletal: Normal range of motion and neck supple. No neck rigidity or muscular tenderness.     Vascular: No JVD.     Trachea: No tracheal deviation.  Cardiovascular:     Rate and Rhythm: Normal rate.  Pulmonary:     Effort: Pulmonary effort is normal.     Breath sounds: Wheezing present.     Comments: Diffuse  expiratory wheezing.  Speaking in full sentences without difficulty. Chest:     Chest wall: No tenderness.  Abdominal:     General: Bowel sounds are normal. There is no distension.     Tenderness: There is no abdominal tenderness. There is no guarding or rebound.  Skin:    General: Skin is warm and dry.     Findings: No erythema.  Neurological:     General: No focal deficit present.     Mental Status: He is alert.  Psychiatric:        Behavior: Behavior normal.      ED Treatments / Results  Labs (all labs ordered are listed, but only abnormal results are displayed) Labs Reviewed - No data to display  EKG None  Radiology Dg Chest 2 View  Result Date: 11/02/2018 CLINICAL DATA:  Fever for 2 days EXAM: CHEST - 2 VIEW COMPARISON:  April 28, 2018 FINDINGS: The heart size and mediastinal contours are within normal limits. Both lungs are clear. The visualized skeletal structures are unremarkable. IMPRESSION: No active cardiopulmonary disease. Electronically Signed   By: Sherian ReinWei-Chen  Lin M.D.   On: 11/02/2018 09:42    Procedures Procedures (including critical care time)  Medications Ordered in ED Medications  ipratropium-albuterol (DUONEB) 0.5-2.5 (3) MG/3ML nebulizer solution 3 mL (3 mLs Nebulization Given 11/02/18 1023)  ipratropium-albuterol (DUONEB) 0.5-2.5 (3) MG/3ML nebulizer solution 3 mL (3 mLs Nebulization Given 11/02/18 0940)  albuterol (PROVENTIL) (2.5 MG/3ML) 0.083% nebulizer solution 2.5 mg (2.5 mg Nebulization Given 11/02/18 0940)     Initial Impression / Assessment and Plan / ED Course  I have reviewed the triage vital signs and the nursing notes.  Pertinent labs & imaging results that were available during my care of the patient were reviewed by me and considered in my medical decision making (see chart for details).     Patient with history of asthma presenting for flulike symptoms for 3 to 4 days.  He is afebrile, vital signs stable.  He is nontoxic in appearance.   No increased work of breathing noted on examination.  He had diffuse expiratory wheezing on auscultation of lungs after administration of a breathing treatment which continue to improve after administration of the second breathing treatment.  He was ambulated with low-normal SPO2 saturations but had no difficulty ambulating.  Chest x-ray shows no evidence of acute cardiopulmonary abnormality.  Suspect viral process.  Doubt ACS/MI or PE.  Will discharge with prednisone burst.  He has albuterol nebulizers and breathing treatments at home.  Discussed symptomatic management.  Recommend follow-up with PCP for reevaluation.  Discussed strict  ED return precautions. Pt verbalized understanding of and agreement with plan and is safe for discharge home at this time.  Final Clinical Impressions(s) / ED Diagnoses   Final diagnoses:  Influenza-like illness  Wheezing    ED Discharge Orders         Ordered    predniSONE (DELTASONE) 10 MG tablet  Daily with breakfast     11/02/18 1033           Jeanie Sewer, PA-C 11/02/18 1035    Maia Plan, MD 11/02/18 1126

## 2018-11-02 NOTE — ED Notes (Addendum)
Pt c/o chest congestion and tightness. Pt states he has been taking mucinex to treat symptoms, but states no medications taken today. Pt also c/o fever x 2 days. Pt resting on stretcher.

## 2018-11-28 IMAGING — DX DG CHEST 2V
2 series · 2 of 2 positions shown · non-contrast
Comparison: 10/09/2017 and 04/28/2017 chest x-ray.

CLINICAL DATA: 36-year-old male with cough.  Subsequent encounter.

EXAM:
CHEST  2 VIEW

[chest pa]
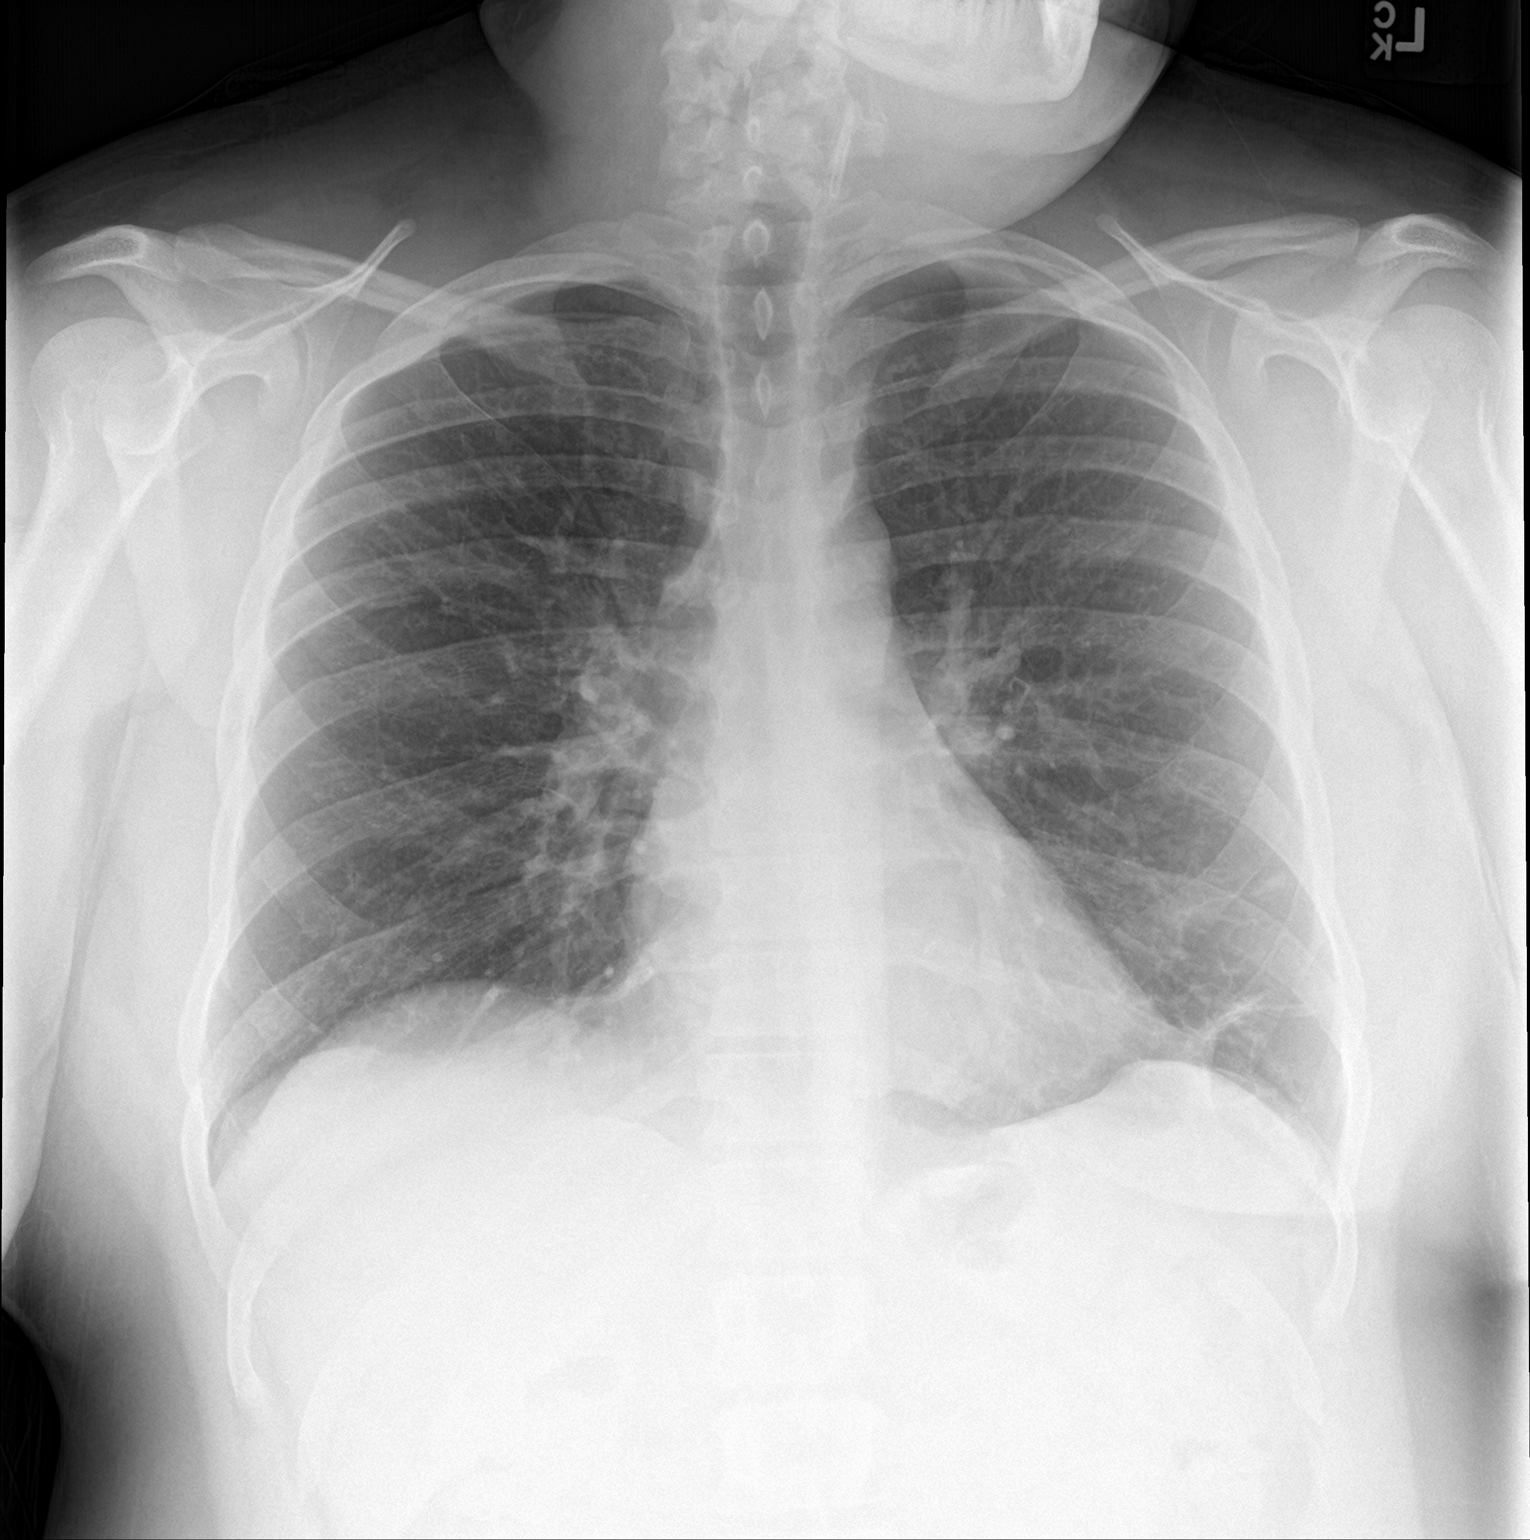

[chest lat]
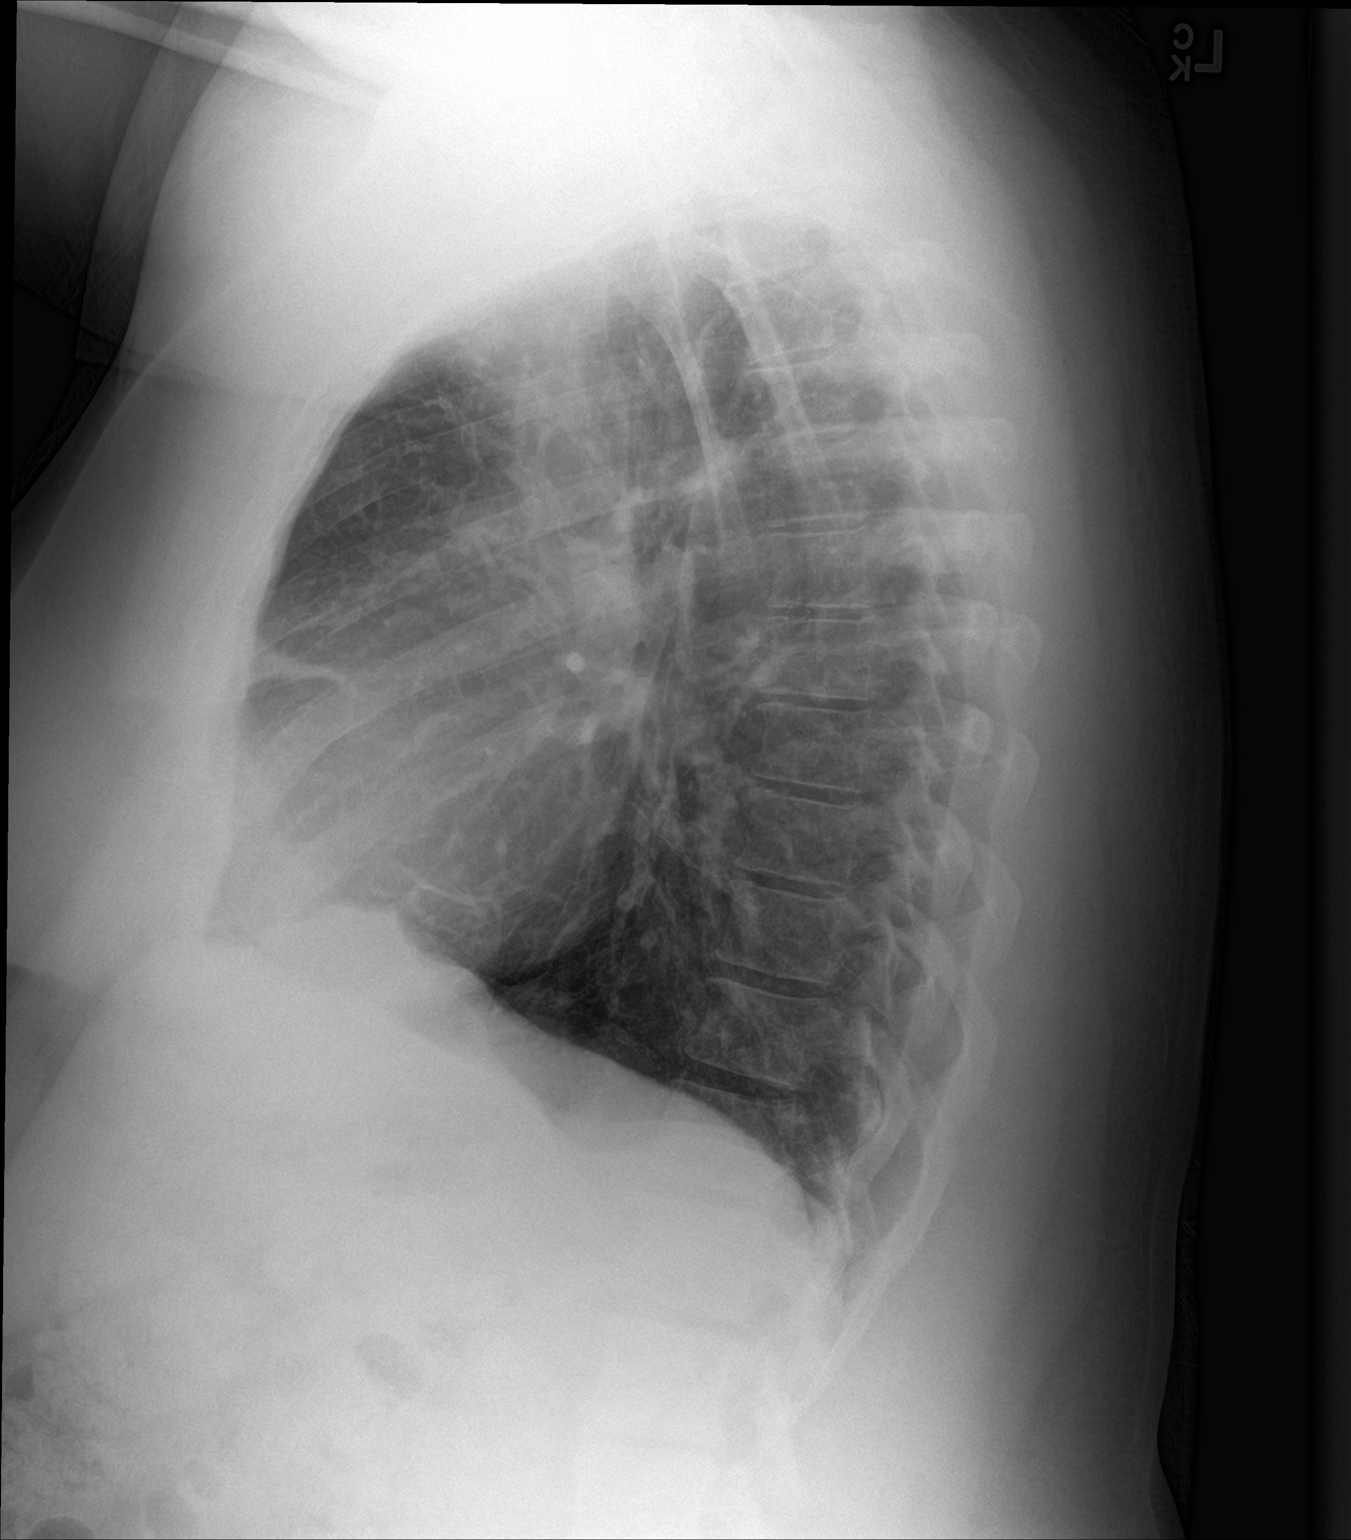

[2 of 2 positions shown; findings below may reference images not displayed]

FINDINGS: Lingula parenchymal changes suggestive of subsegmental atelectasis
although subtle infiltrate not excluded in the proper clinical
setting.

No pulmonary edema or pneumothorax.

Heart size within normal limits.

No acute osseous abnormality.
IMPRESSION: Lingula parenchymal changes suggestive of subsegmental atelectasis
although subtle infiltrate not excluded in the proper clinical
setting.

## 2019-02-11 IMAGING — CR DG CHEST 2V
2 series · 2 of 2 positions shown · non-contrast
Comparison: 10/14/2017 and earlier.

CLINICAL DATA: 36-year-old male with sore throat for 3 days. Cough
and congestion.

EXAM:
CHEST - 2 VIEW

[w chest pa]
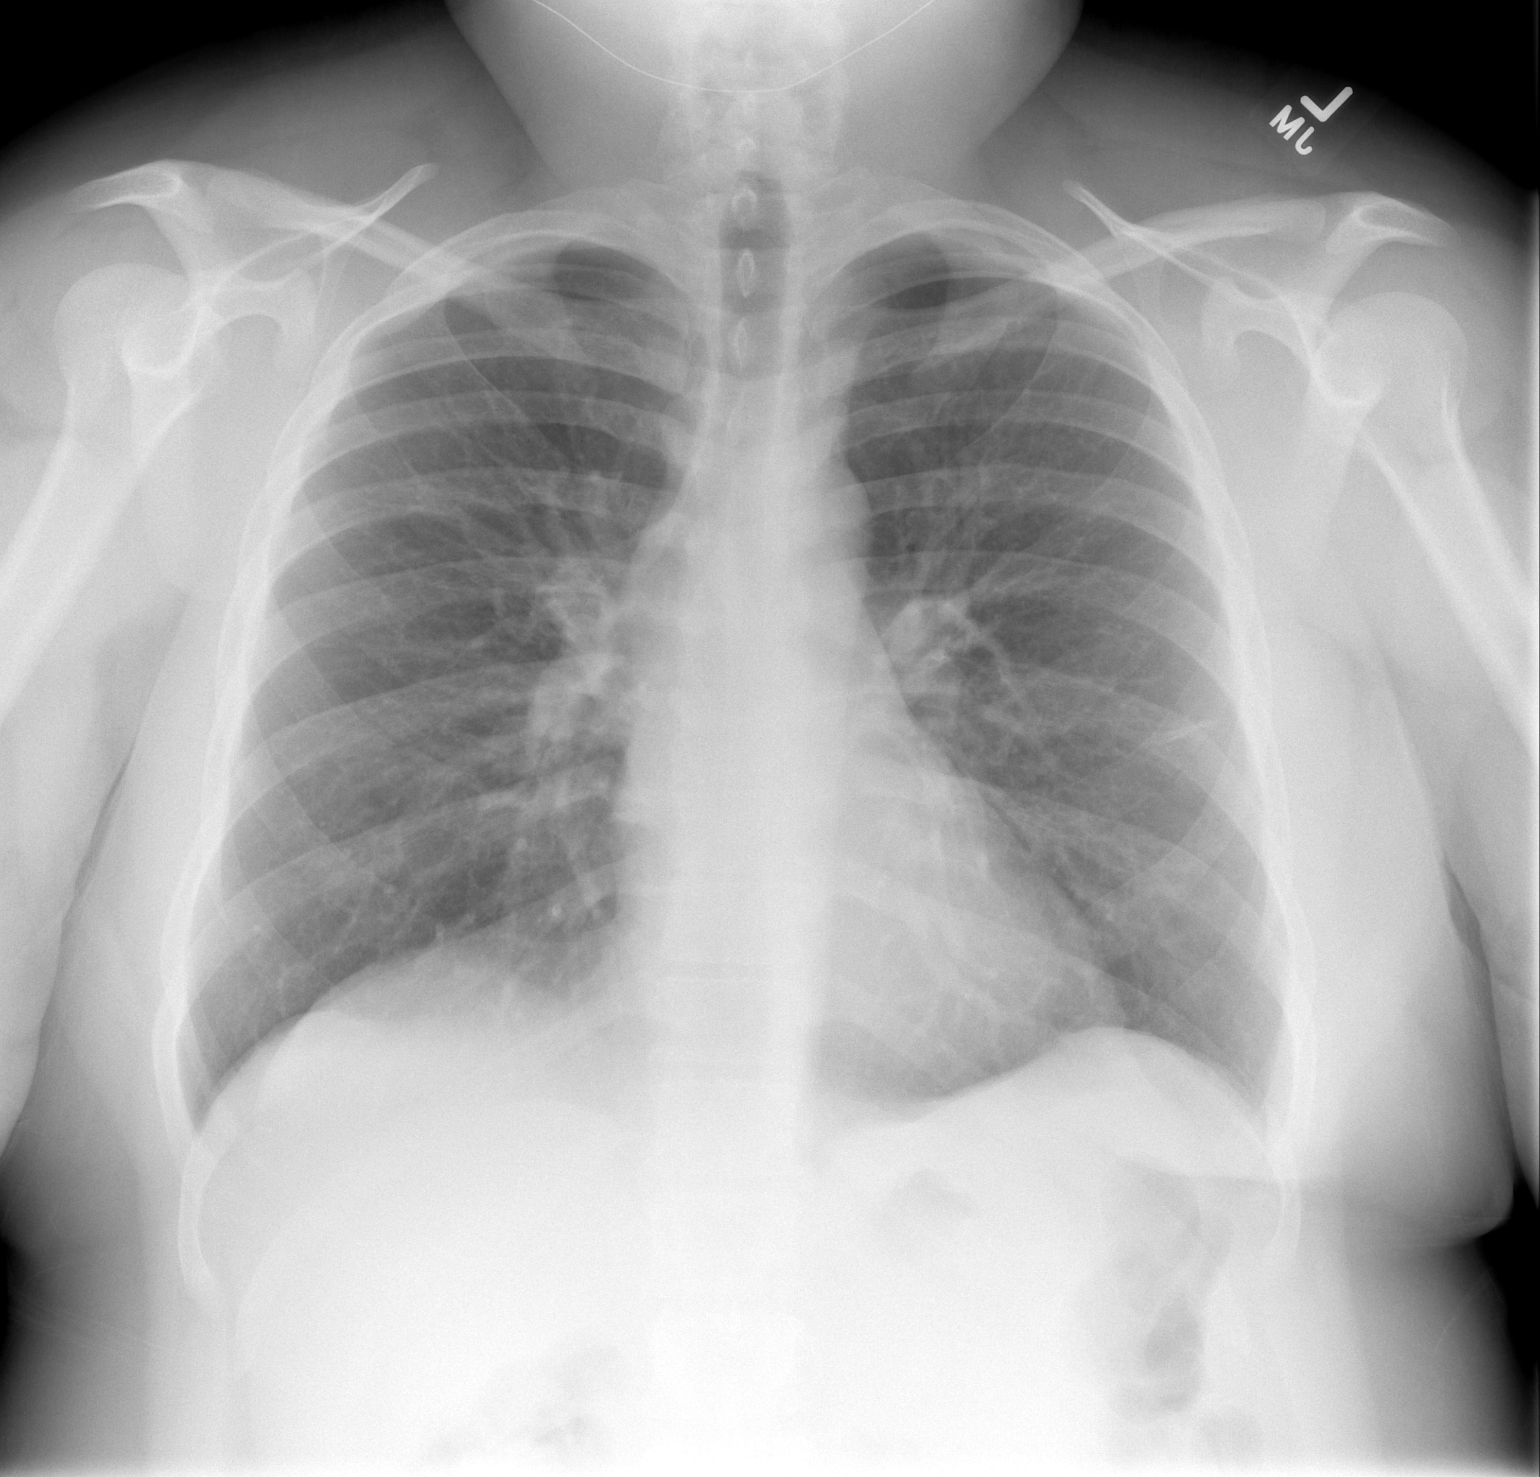

[w chest lat]
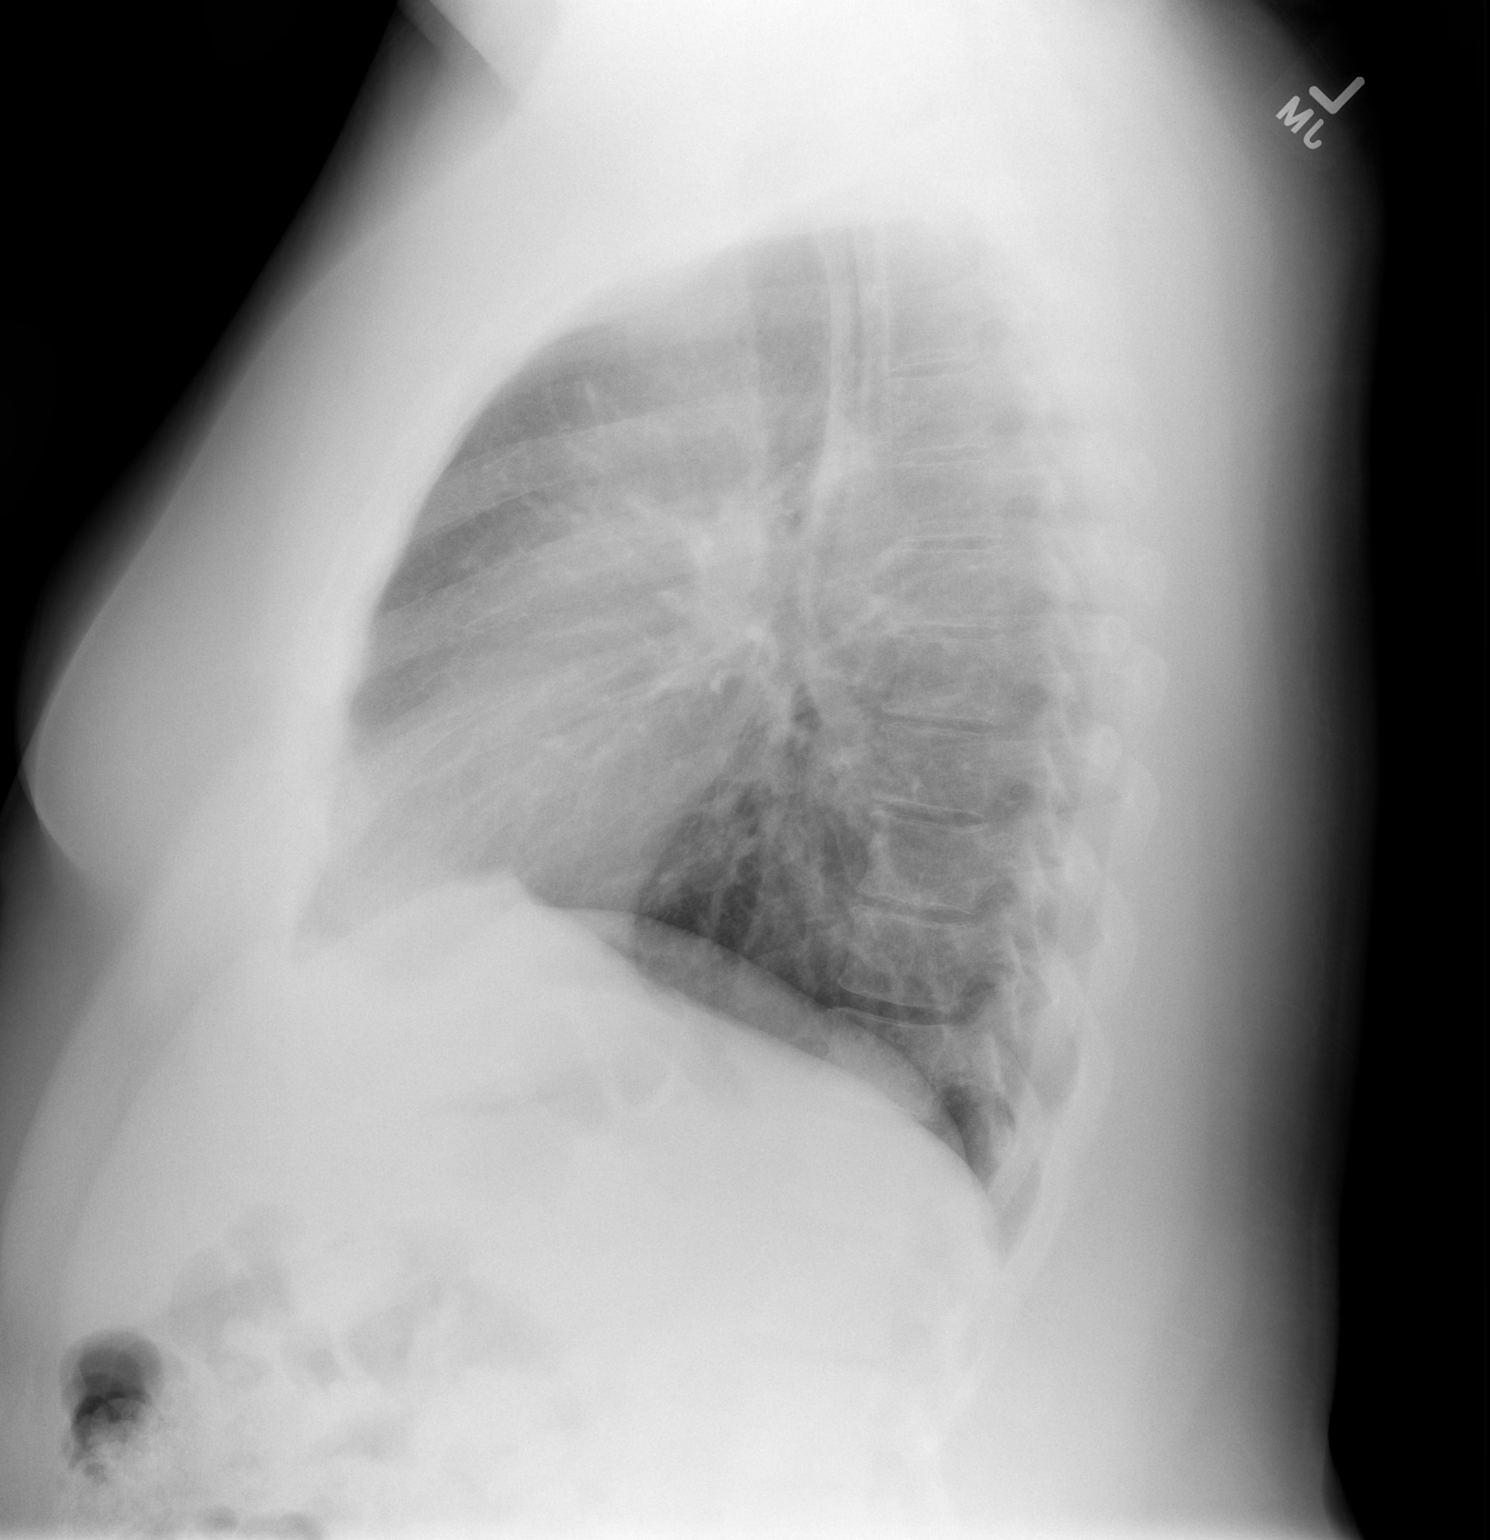

[2 of 2 positions shown; findings below may reference images not displayed]

FINDINGS: Base in [REDACTED] has largely mild streaky opacity seen at the left
lung resolved. There is minimal linear opacity in the left mid lung
which most resembles atelectasis. Lung volumes remain within normal
limits. Mild eventration of the diaphragm (normal variant). No
pneumothorax, pulmonary edema, pleural effusion or other confluent
opacity. Mediastinal contours remain normal. Visualized tracheal air
column is within normal limits. No acute osseous abnormality
identified. Negative visible bowel gas pattern.
IMPRESSION: Improved left lung base ventilation since [REDACTED].

Minor atelectasis, no other acute cardiopulmonary abnormality.

## 2019-08-02 ENCOUNTER — Other Ambulatory Visit: Payer: Self-pay

## 2019-08-02 DIAGNOSIS — Z20822 Contact with and (suspected) exposure to covid-19: Secondary | ICD-10-CM

## 2019-08-03 LAB — NOVEL CORONAVIRUS, NAA: SARS-CoV-2, NAA: NOT DETECTED

## 2019-10-27 ENCOUNTER — Ambulatory Visit: Payer: Self-pay | Attending: Internal Medicine

## 2019-10-29 ENCOUNTER — Other Ambulatory Visit: Payer: Self-pay

## 2019-10-29 DIAGNOSIS — Z20822 Contact with and (suspected) exposure to covid-19: Secondary | ICD-10-CM

## 2019-10-30 LAB — NOVEL CORONAVIRUS, NAA: SARS-CoV-2, NAA: NOT DETECTED

## 2019-12-17 IMAGING — CR DG CHEST 2V
2 series · 2 of 2 positions shown · non-contrast
Comparison: April 28, 2018

CLINICAL DATA: Fever for 2 days

EXAM:
CHEST - 2 VIEW

[w chest pa]
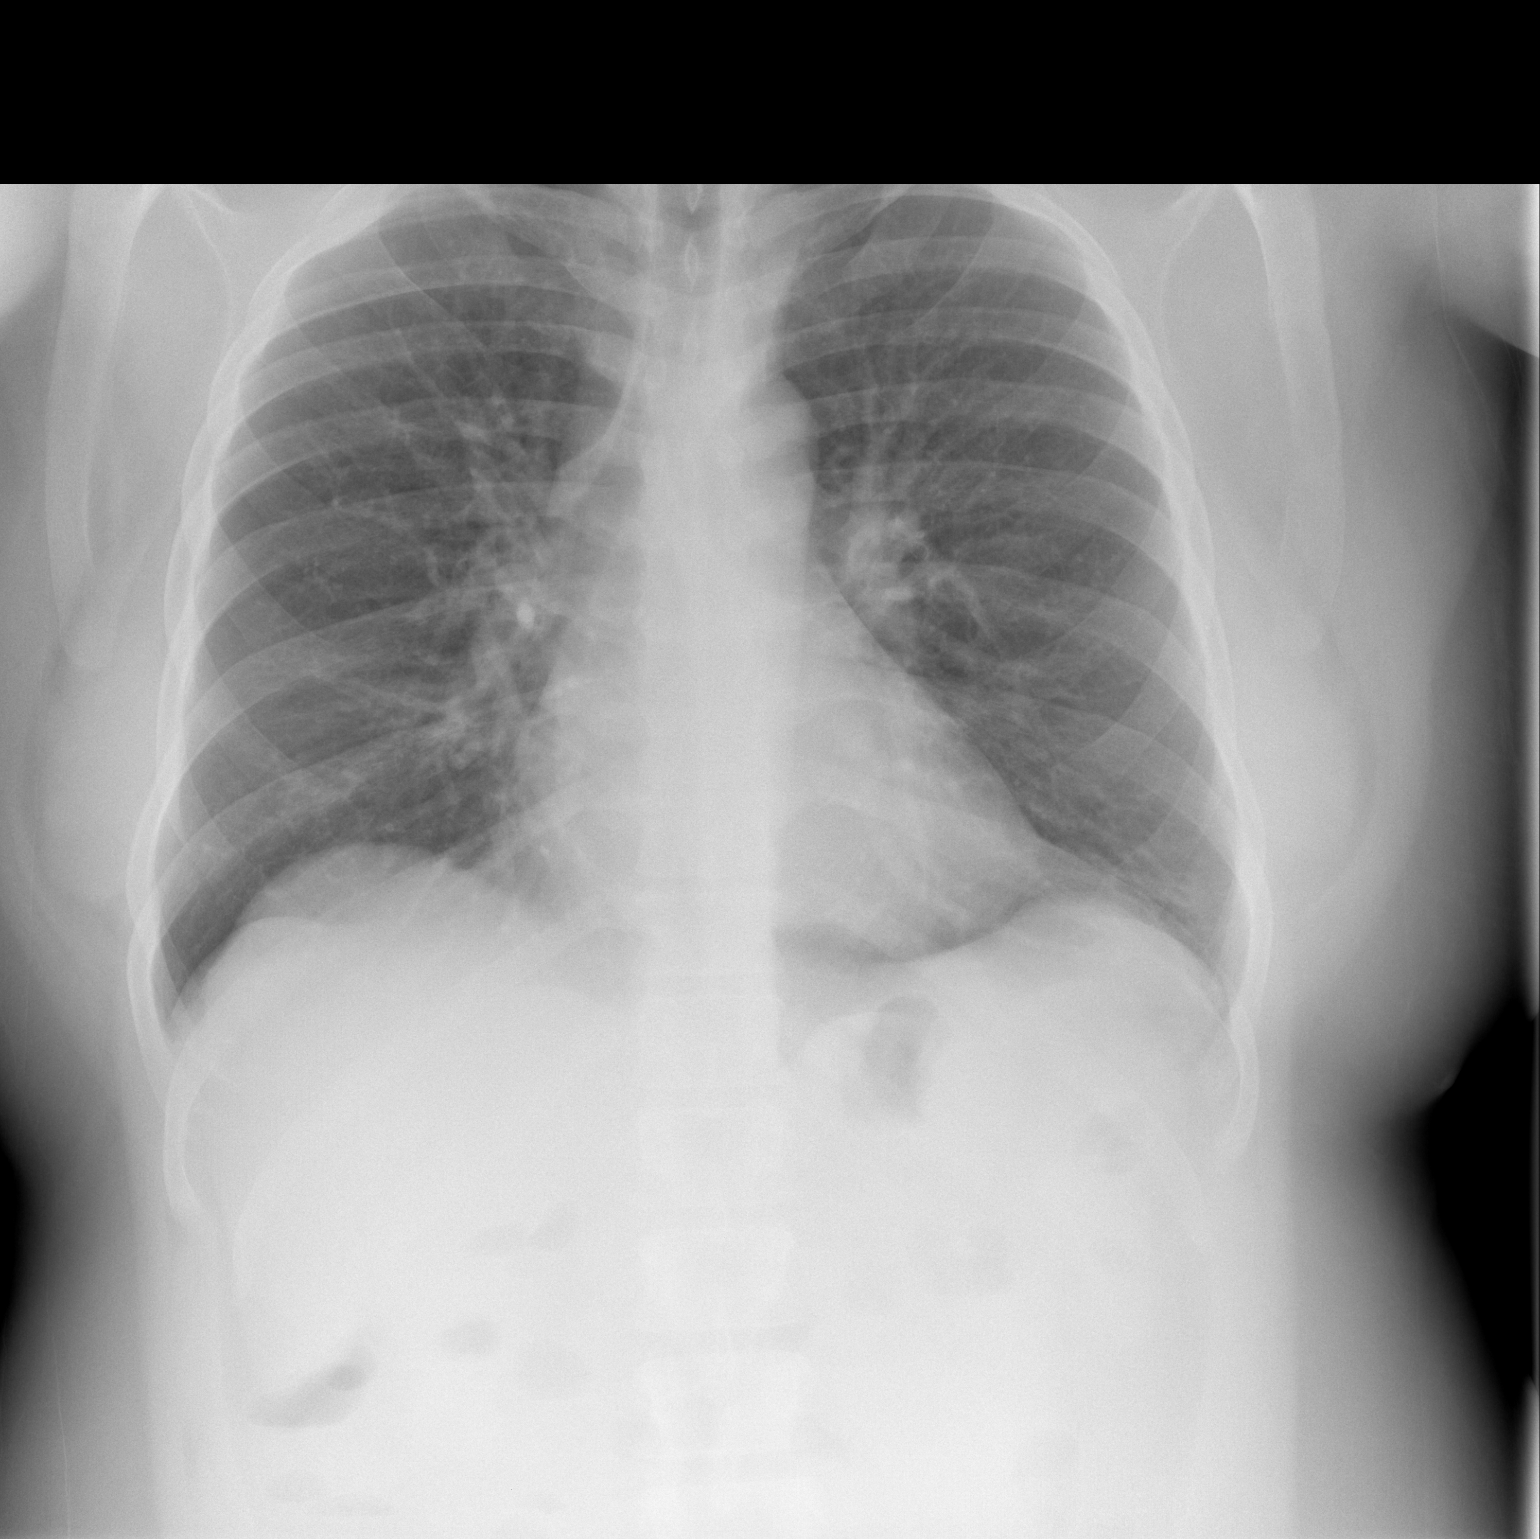

[w chest lat]
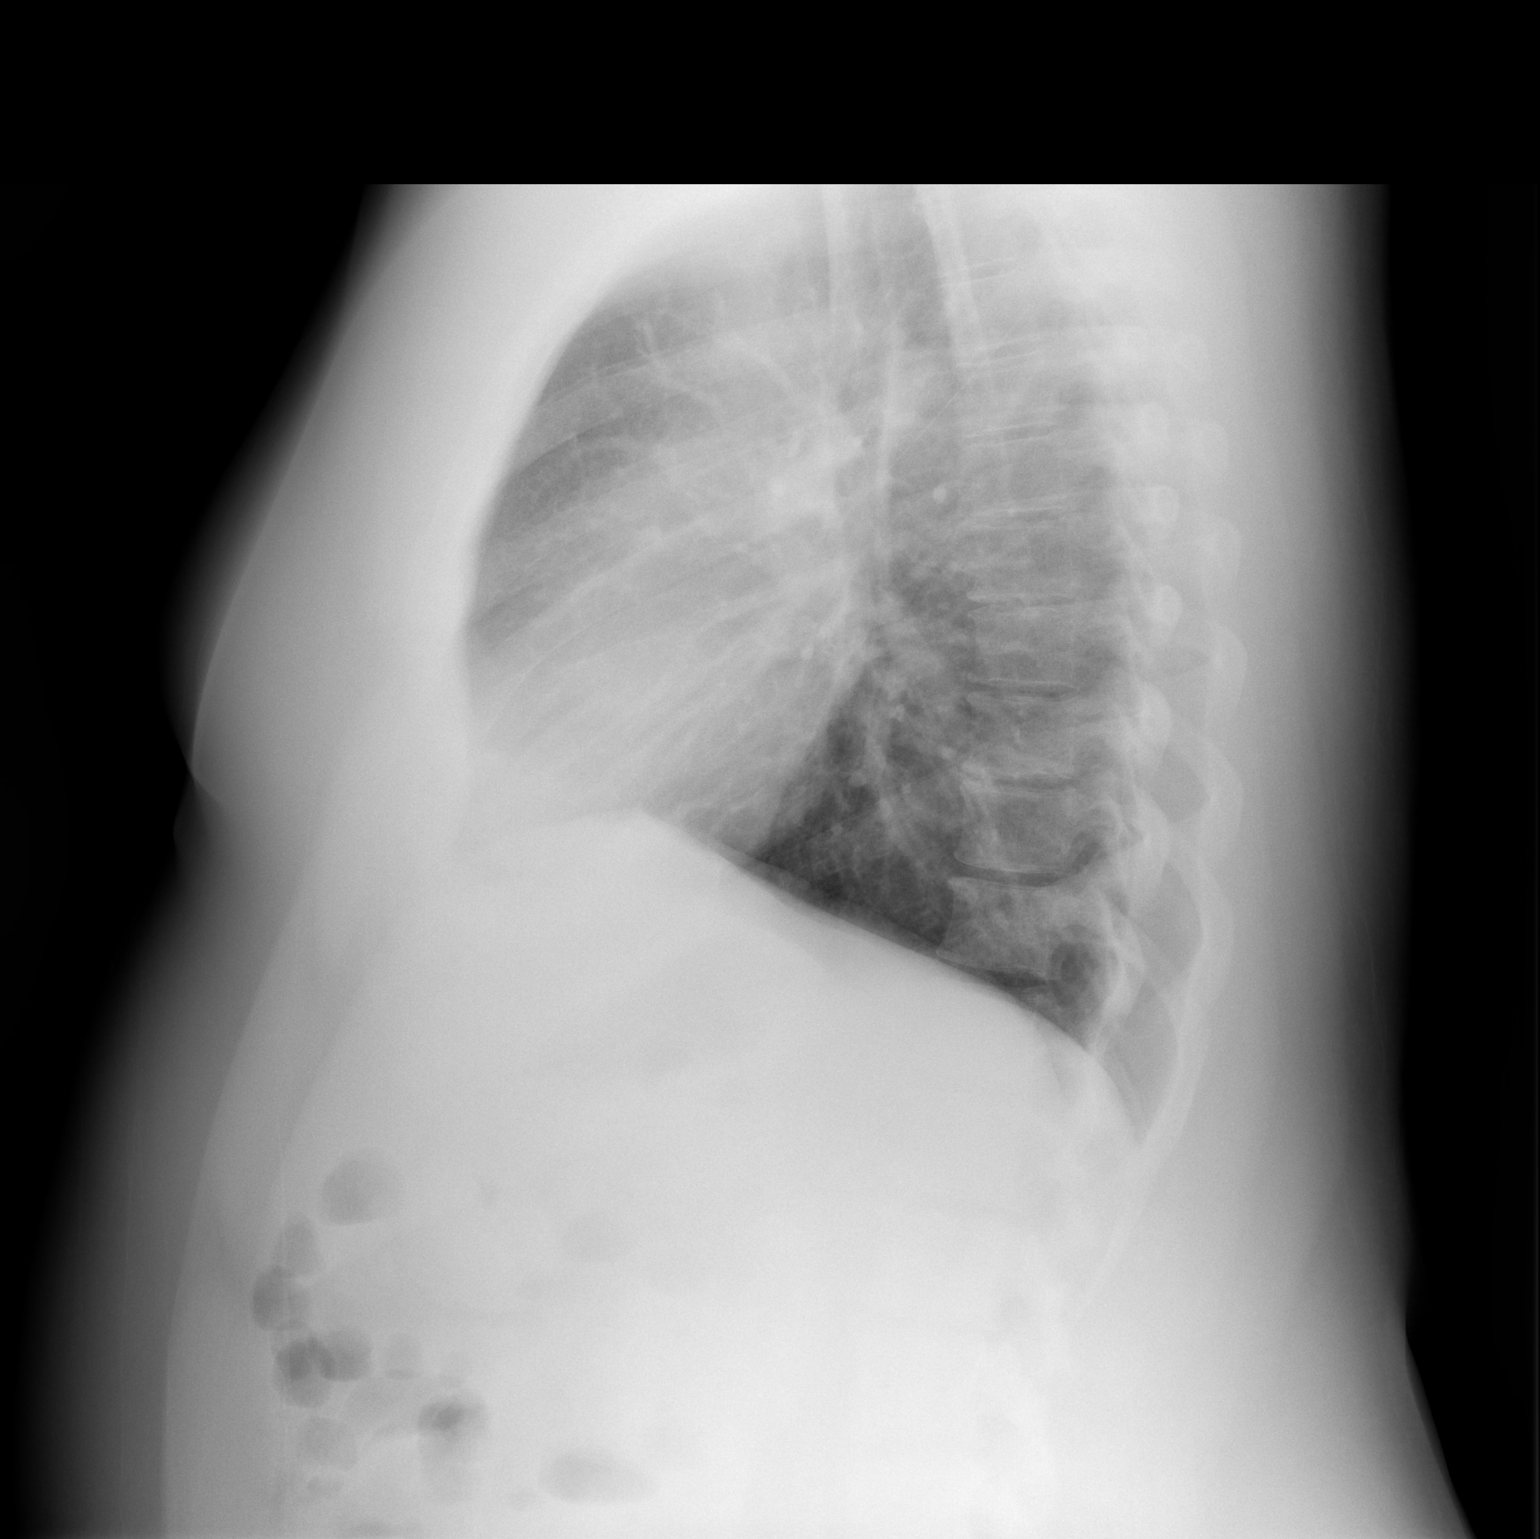

[2 of 2 positions shown; findings below may reference images not displayed]

FINDINGS: The heart size and mediastinal contours are within normal limits.
Both lungs are clear. The visualized skeletal structures are
unremarkable.
IMPRESSION: No active cardiopulmonary disease.

## 2021-02-20 ENCOUNTER — Emergency Department (HOSPITAL_BASED_OUTPATIENT_CLINIC_OR_DEPARTMENT_OTHER): Payer: Self-pay

## 2021-02-20 ENCOUNTER — Emergency Department (HOSPITAL_BASED_OUTPATIENT_CLINIC_OR_DEPARTMENT_OTHER)
Admission: EM | Admit: 2021-02-20 | Discharge: 2021-02-20 | Disposition: A | Discharge status: Home / Self Care | Payer: Self-pay | Attending: Emergency Medicine | Admitting: Emergency Medicine

## 2021-02-20 ENCOUNTER — Encounter (HOSPITAL_BASED_OUTPATIENT_CLINIC_OR_DEPARTMENT_OTHER): Payer: Self-pay

## 2021-02-20 ENCOUNTER — Other Ambulatory Visit: Payer: Self-pay

## 2021-02-20 DIAGNOSIS — Z5321 Procedure and treatment not carried out due to patient leaving prior to being seen by health care provider: Secondary | ICD-10-CM | POA: Insufficient documentation

## 2021-02-20 DIAGNOSIS — R2243 Localized swelling, mass and lump, lower limb, bilateral: Secondary | ICD-10-CM | POA: Insufficient documentation

## 2021-02-20 DIAGNOSIS — R079 Chest pain, unspecified: Secondary | ICD-10-CM | POA: Insufficient documentation

## 2021-02-20 HISTORY — DX: Other allergic rhinitis: J30.89

## 2021-02-20 NOTE — ED Triage Notes (Addendum)
Pt c/o CP x 3 days-swelling to bilat legs x 1 month-denies cough/fever-NAD-steady gait

## 2022-04-25 ENCOUNTER — Encounter (HOSPITAL_BASED_OUTPATIENT_CLINIC_OR_DEPARTMENT_OTHER): Payer: Self-pay | Admitting: Emergency Medicine

## 2022-04-25 ENCOUNTER — Other Ambulatory Visit: Payer: Self-pay

## 2022-04-25 ENCOUNTER — Emergency Department (HOSPITAL_BASED_OUTPATIENT_CLINIC_OR_DEPARTMENT_OTHER)
Admission: EM | Admit: 2022-04-25 | Discharge: 2022-04-25 | Disposition: A | Discharge status: Home / Self Care | Payer: Self-pay | Attending: Emergency Medicine | Admitting: Emergency Medicine

## 2022-04-25 ENCOUNTER — Other Ambulatory Visit (HOSPITAL_BASED_OUTPATIENT_CLINIC_OR_DEPARTMENT_OTHER): Payer: Self-pay

## 2022-04-25 DIAGNOSIS — H109 Unspecified conjunctivitis: Secondary | ICD-10-CM

## 2022-04-25 DIAGNOSIS — H1033 Unspecified acute conjunctivitis, bilateral: Secondary | ICD-10-CM | POA: Insufficient documentation

## 2022-04-25 MED ORDER — TRANEXAMIC ACID 1000 MG/10ML IV SOLN: 500.0000 mg | Freq: Once | INTRAVENOUS | Status: DC

## 2022-04-25 MED ORDER — POLYMYXIN B-TRIMETHOPRIM 10000-0.1 UNIT/ML-% OP SOLN
1.0000 [drp] | OPHTHALMIC | 0 refills | Status: AC
  Filled 2022-04-25: qty 10, 34d supply, fill #0

## 2022-04-25 MED ORDER — TRANEXAMIC ACID-NACL 1000-0.7 MG/100ML-% IV SOLN
INTRAVENOUS | Status: AC
  Filled 2022-04-25: qty 100

## 2022-04-25 NOTE — ED Provider Notes (Signed)
[facility] EMERGENCY DEPARTMENT Provider Note   CSN: [id number] Arrival date & time: 04/25/22  0753     History  Chief Complaint  Patient presents with   Eye Problem    Benjamin [name] is a 41 y.o. male.  Presented to ER for eye problem.  Patient states for the last 3 or so weeks he has had some itching in both of his eyes, discomfort but no pain in the eyes.  Also has noted some drainage from both of his eyes and some redness in his eyes.  He says yesterday he thinks his eyes felt a little bit blurry but today and otherwise has not had any blurriness or change in his vision.  He does not wear contacts, has not noted any skin rashes or fevers.  No known trauma, no known foreign bodies.  No foreign body sensation.  HPI     Home Medications Prior to Admission medications   Medication Sig Start Date End Date Taking? Authorizing Provider  trimethoprim-polymyxin b (POLYTRIM) ophthalmic solution Place 1 drop into both eyes every 4 (four) hours for 7 days. 04/25/22 05/29/22 Yes Nader Boys, Quitman Livings, MD  albuterol (PROVENTIL HFA;VENTOLIN HFA) 108 (90 Base) MCG/ACT inhaler Inhale 1-2 puffs into the lungs every 6 (six) hours as needed for wheezing or shortness of breath. 04/28/18   Azalia Bilis, MD  lidocaine (XYLOCAINE) 2 % solution Use as directed 20 mLs in the mouth or throat as needed for mouth pain. 12/28/17   Rise Mu, PA-C      Allergies    Other    Review of Systems   Review of Systems  Constitutional:  Negative for chills and fever.  HENT:  Negative for ear pain and sore throat.   Eyes:  Positive for discharge, redness and itching. Negative for pain and visual disturbance.  Respiratory:  Negative for cough and shortness of breath.   Cardiovascular:  Negative for chest pain and palpitations.  Gastrointestinal:  Negative for abdominal pain and vomiting.  Genitourinary:  Negative for dysuria and hematuria.  Musculoskeletal:  Negative for arthralgias and  back pain.  Skin:  Negative for color change and rash.  Neurological:  Negative for seizures and syncope.  All other systems reviewed and are negative.   Physical Exam Updated Vital Signs BP 123/79   Pulse 96   Temp 98.8 F (37.1 C) (Oral)   Resp 18   Ht 5\' 3"  (1.6 m)   Wt 95.3 kg   SpO2 95%   BMI 37.20 kg/m  Physical Exam Vitals and nursing note reviewed.  Constitutional:      General: He is not in acute distress.    Appearance: He is well-developed.  HENT:     Head: Normocephalic and atraumatic.  Eyes:     Conjunctiva/sclera: Conjunctivae normal.     Comments: Mild conjunctival injection noted bilaterally, EOM's normal in both eyes, pupils are equal round and reactive to light, there is no overlying erythema to either orbital regions  Cardiovascular:     Rate and Rhythm: Normal rate.     Pulses: Normal pulses.  Pulmonary:     Effort: Pulmonary effort is normal. No respiratory distress.  Musculoskeletal:        General: No swelling or deformity.     Cervical back: Neck supple.  Skin:    General: Skin is warm and dry.     Capillary Refill: Capillary refill takes less than 2 seconds.  Neurological:     Mental Status: He  is alert.  Psychiatric:        Mood and Affect: Mood normal.     ED Results / Procedures / Treatments   Labs (all labs ordered are listed, but only abnormal results are displayed) Labs Reviewed - No data to display  EKG None  Radiology No results found.  Procedures Procedures    Medications Ordered in ED Medications - No data to display  ED Course/ Medical Decision Making/ A&P                           Medical Decision Making Risk Prescription drug management.   41 year old male presenting to ER due to concern for eye itchiness/drainage/redness.  On exam patient has mild redness in both of his eyes I do not appreciate any sort of purulent drainage and I did not appreciate any overlying erythema.  His EOM is normal, pupils appear  normal.  Based on his physical exam I suspect patient has some conjunctivitis.  Given duration of symptoms, advised that he follow-up with an ophthalmologist for further evaluation.  Will give Rx for some antibiotic drops for now.  Reviewed return precautions in detail and discharged home.  After the discussed management above, the patient was determined to be safe for discharge.  The patient was in agreement with this plan and all questions regarding their care were answered.  ED return precautions were discussed and the patient will return to the ED with any significant worsening of condition.         Final Clinical Impression(s) / ED Diagnoses Final diagnoses:  Conjunctivitis of both eyes, unspecified conjunctivitis type    Rx / DC Orders ED Discharge Orders          Ordered    trimethoprim-polymyxin b (POLYTRIM) ophthalmic solution  Every 4 hours        04/25/22 0835              Milagros Loll, MD 04/25/22 [phone]

## 2022-04-25 NOTE — ED Notes (Signed)
Pt discharged to home. Discharge instructions have been discussed with patient and/or family members. Pt verbally acknowledges understanding d/c instructions, and endorses comprehension to checkout at registration before leaving.  °

## 2022-04-25 NOTE — ED Triage Notes (Signed)
Pt arrives pov, steady gait, c/o bilateral eye redness, itching and drainage x 3 weeks. Endorses concern for conjunctivitis. Denies injury

## 2022-04-25 NOTE — Discharge Instructions (Signed)
Take the antibiotic drop.  Call the ophthalmologist this morning to request appointment as soon as possible.  Come back to ER if you are having any blurry vision, worsening drainage, redness, pain with eye movement, fever or other new concerning symptom.

## 2022-08-15 ENCOUNTER — Other Ambulatory Visit: Payer: Self-pay

## 2022-08-15 ENCOUNTER — Emergency Department (HOSPITAL_BASED_OUTPATIENT_CLINIC_OR_DEPARTMENT_OTHER)
Admission: EM | Admit: 2022-08-15 | Discharge: 2022-08-15 | Disposition: A | Discharge status: Home / Self Care | Payer: Commercial Managed Care - HMO | Attending: Emergency Medicine | Admitting: Emergency Medicine

## 2022-08-15 ENCOUNTER — Emergency Department (HOSPITAL_BASED_OUTPATIENT_CLINIC_OR_DEPARTMENT_OTHER): Payer: Commercial Managed Care - HMO

## 2022-08-15 ENCOUNTER — Encounter (HOSPITAL_BASED_OUTPATIENT_CLINIC_OR_DEPARTMENT_OTHER): Payer: Self-pay

## 2022-08-15 ENCOUNTER — Other Ambulatory Visit (HOSPITAL_BASED_OUTPATIENT_CLINIC_OR_DEPARTMENT_OTHER): Payer: Self-pay

## 2022-08-15 DIAGNOSIS — R0602 Shortness of breath: Secondary | ICD-10-CM | POA: Diagnosis present

## 2022-08-15 DIAGNOSIS — J4521 Mild intermittent asthma with (acute) exacerbation: Secondary | ICD-10-CM | POA: Diagnosis not present

## 2022-08-15 DIAGNOSIS — Z20822 Contact with and (suspected) exposure to covid-19: Secondary | ICD-10-CM | POA: Insufficient documentation

## 2022-08-15 DIAGNOSIS — R059 Cough, unspecified: Secondary | ICD-10-CM | POA: Diagnosis not present

## 2022-08-15 LAB — CBC
HCT: 47.7 % (ref 39.0–52.0)
Hemoglobin: 15.7 g/dL (ref 13.0–17.0)
MCH: 30.5 pg (ref 26.0–34.0)
MCHC: 32.9 g/dL (ref 30.0–36.0)
MCV: 92.6 fL (ref 80.0–100.0)
Platelets: 206 10*3/uL (ref 150–400)
RBC: 5.15 MIL/uL (ref 4.22–5.81)
RDW: 12.9 % (ref 11.5–15.5)
WBC: 6.4 10*3/uL (ref 4.0–10.5)
nRBC: 0 % (ref 0.0–0.2)

## 2022-08-15 LAB — RESP PANEL BY RT-PCR (FLU A&B, COVID) ARPGX2
Influenza A by PCR: NEGATIVE
Influenza B by PCR: NEGATIVE
SARS Coronavirus 2 by RT PCR: NEGATIVE

## 2022-08-15 LAB — BASIC METABOLIC PANEL
Anion gap: 5 (ref 5–15)
BUN: 16 mg/dL (ref 6–20)
CO2: 28 mmol/L (ref 22–32)
Calcium: 9 mg/dL (ref 8.9–10.3)
Chloride: 104 mmol/L (ref 98–111)
Creatinine, Ser: 0.96 mg/dL (ref 0.61–1.24)
GFR, Estimated: >60 mL/min (ref >60)
Glucose, Bld: 97 mg/dL (ref 70–99)
Potassium: 4.3 mmol/L (ref 3.5–5.1)
Sodium: 137 mmol/L (ref 135–145)

## 2022-08-15 MED ORDER — PREDNISONE 20 MG PO TABS
40.0000 mg | ORAL_TABLET | Freq: Every day | ORAL | 0 refills | Status: DC
  Filled 2022-08-15: qty 10, 5d supply, fill #0

## 2022-08-15 MED ORDER — ALBUTEROL SULFATE HFA 108 (90 BASE) MCG/ACT IN AERS
1.0000 | INHALATION_SPRAY | Freq: Four times a day (QID) | RESPIRATORY_TRACT | 1 refills | Status: AC | PRN
  Filled 2022-08-15: qty 6.7, 25d supply, fill #0

## 2022-08-15 MED ORDER — IPRATROPIUM-ALBUTEROL 0.5-2.5 (3) MG/3ML IN SOLN
RESPIRATORY_TRACT | Status: AC
  Administered 2022-08-15: 3 mL
  Filled 2022-08-15: qty 6

## 2022-08-15 MED ORDER — IPRATROPIUM-ALBUTEROL 0.5-2.5 (3) MG/3ML IN SOLN
3.0000 mL | Freq: Once | RESPIRATORY_TRACT | Status: AC
  Administered 2022-08-15: 3 mL via RESPIRATORY_TRACT

## 2022-08-15 NOTE — ED Provider Notes (Signed)
[facility] EMERGENCY DEPARTMENT Provider Note   CSN: [id number] Arrival date & time: 08/15/22  0854     History  Chief Complaint  Patient presents with   Shortness of Breath    Benjamin [name] is a 41 y.o. male with past medical history significant for asthma, allergies who presents with shortness of breath, productive cough with green mucus for the last 2 days.  He reports his chest feels tight.  He reports that his girlfriend has been sick, he tested negative for COVID at home, he denies recent COVID or flu vaccine.  Patient reports that he has albuterol rescue inhaler at home, no chronic daily medications.   Shortness of Breath      Home Medications Prior to Admission medications   Medication Sig Start Date End Date Taking? Authorizing Provider  albuterol (VENTOLIN HFA) 108 (90 Base) MCG/ACT inhaler Inhale 1-2 puffs into the lungs every 6 (six) hours as needed for wheezing or shortness of breath. 08/15/22  Yes Benjamin Morrical H, PA-C  predniSONE (DELTASONE) 20 MG tablet Take 2 tablets (40 mg total) by mouth daily. 08/15/22  Yes Benjamin Morgan H, PA-C  lidocaine (XYLOCAINE) 2 % solution Use as directed 20 mLs in the mouth or throat as needed for mouth pain. 12/28/17   Benjamin Mu, PA-C      Allergies    Other    Review of Systems   Review of Systems  Respiratory:  Positive for shortness of breath.   All other systems reviewed and are negative.   Physical Exam Updated Vital Signs BP 116/76   Pulse 65   Temp 98.5 F (36.9 C)   Resp 13   Ht 5\' 3"  (1.6 m)   Wt 104.3 kg   SpO2 94%   BMI 40.74 kg/m  Physical Exam Vitals and nursing note reviewed.  Constitutional:      General: He is not in acute distress.    Appearance: Normal appearance.  HENT:     Head: Normocephalic and atraumatic.  Eyes:     General:        Right eye: No discharge.        Left eye: No discharge.  Cardiovascular:     Rate and Rhythm: Normal rate and regular  rhythm.     Heart sounds: No murmur heard.    No friction rub. No gallop.  Pulmonary:     Effort: Pulmonary effort is normal.     Breath sounds: Normal breath sounds.     Comments: Patient with minimal expiratory wheezing in the lung bases, no stridor, rhonchi, rales Abdominal:     General: Bowel sounds are normal.     Palpations: Abdomen is soft.  Skin:    General: Skin is warm and dry.     Capillary Refill: Capillary refill takes less than 2 seconds.  Neurological:     Mental Status: He is alert and oriented to person, place, and time.  Psychiatric:        Mood and Affect: Mood normal.        Behavior: Behavior normal.     ED Results / Procedures / Treatments   Labs (all labs ordered are listed, but only abnormal results are displayed) Labs Reviewed  RESP PANEL BY RT-PCR (FLU A&B, COVID) ARPGX2  CBC  BASIC METABOLIC PANEL    EKG None  Radiology DG Chest 2 View  Result Date: 08/15/2022 CLINICAL DATA:  Shortness of breath EXAM: CHEST - 2 VIEW COMPARISON:  Radiograph 02/20/2021  FINDINGS: Unchanged cardiomediastinal silhouette. There is no focal airspace consolidation. There is no pleural effusion or pneumothorax. There is no acute osseous abnormality. IMPRESSION: No evidence of acute cardiopulmonary disease. Electronically Signed   By: Benjamin Simmering M.D.   On: 08/15/2022 09:54    Procedures Procedures    Medications Ordered in ED Medications  ipratropium-albuterol (DUONEB) 0.5-2.5 (3) MG/3ML nebulizer solution (3 mLs  Given 08/15/22 0907)  ipratropium-albuterol (DUONEB) 0.5-2.5 (3) MG/3ML nebulizer solution 3 mL (3 mLs Nebulization Given 08/15/22 0907)    ED Course/ Medical Decision Making/ A&P                           Medical Decision Making Amount and/or Complexity of Data Reviewed Labs: ordered. Radiology: ordered.  Risk Prescription drug management.   This patient is a 41 y.o. male who presents to the ED for concern of shob, cough, this involves an  extensive number of treatment options, and is a complaint that carries with it a high risk of complications and morbidity. The emergent differential diagnosis prior to evaluation includes, but is not limited to,  asthma exacerbation, COPD exacerbation, acute upper respiratory infection, acute bronchitis, chronic bronchitis, interstitial lung disease, ARDS, PE, pneumonia, atypical ACS, carbon monoxide poisoning, spontaneous pneumothorax versus other .   This is not an exhaustive differential.   Past Medical History / Co-morbidities / Social History: Patient with history of mild intermittent asthma, bronchitis, environmental and seasonal allergies  Additional history: Chart reviewed. Pertinent results include: Reviewed lab work, imaging from previous emergency department visits, patient has not had any asthma exacerbations on record since 2019, his symptoms at that time were diagnosed as moderate asthma, at this time I think he is more in the mild intermittent to persistent category based on the distance between his recent exacerbations, but do encourage him to follow-up with his PCP for further evaluation  Physical Exam: Physical exam performed. The pertinent findings include: On exam patient with some mild expiratory wheezing, congestion, rhonchi that clear with cough, no stridor, respiratory distress, no focal consolidations, wheezing improved after DuoNeb  Lab Tests: I ordered, and personally interpreted labs.  The pertinent results include: CBC, BMP, RVP are unremarkable, RVP negative for COVID, flu   Imaging Studies: I ordered imaging studies including plain film chest x-ray. I independently visualized and interpreted imaging which showed no pneumonia or other acute abnormality in intrathoracic cavity. I agree with the radiologist interpretation.   Medications: I ordered medication including duoneb  for wheezing. Reevaluation of the patient after these medicines showed that the patient  improved. I have reviewed the patients home medicines and have made adjustments as needed.  Disposition: After consideration of the diagnostic results and the patients response to treatment, I feel that patient's symptoms are consistent with a asthma exacerbation in context of mild versus moderate asthma, given that he has had several days of congestion, still having some mild lingering wheezing even after DuoNeb I think would be reasonable to treat him with refill of his outpatient inhaler as well as 5 days of steroid burst, and close PCP follow-up, patient understands agrees to this plan.   emergency department workup does not suggest an emergent condition requiring admission or immediate intervention beyond what has been performed at this time. The plan is: as above. The patient is safe for discharge and has been instructed to return immediately for worsening symptoms, change in symptoms or any other concerns.  I discussed this case with  my attending physician Dr. Theresia Lo who cosigned this note including patient's presenting symptoms, physical exam, and planned diagnostics and interventions. Attending physician stated agreement with plan or made changes to plan which were implemented.    Final Clinical Impression(s) / ED Diagnoses Final diagnoses:  Mild intermittent asthma with (acute) exacerbation    Rx / DC Orders ED Discharge Orders          Ordered    albuterol (VENTOLIN HFA) 108 (90 Base) MCG/ACT inhaler  Every 6 hours PRN        08/15/22 1051    predniSONE (DELTASONE) 20 MG tablet  Daily        08/15/22 1051              Darrian Goodwill, [place] H, PA-C 08/15/22 1054    Rexford Maus, DO 08/15/22 1058

## 2022-08-15 NOTE — ED Triage Notes (Addendum)
Pt reports he woke up this morning SOB. Coughing  green mucous x 2 days. Chest feels tight. Ambulated by staff to room sats 95 % on RA. Girlfriend has been sick tested neg for covid

## 2022-09-01 ENCOUNTER — Other Ambulatory Visit (HOSPITAL_BASED_OUTPATIENT_CLINIC_OR_DEPARTMENT_OTHER): Payer: Self-pay

## 2023-05-02 ENCOUNTER — Emergency Department (HOSPITAL_BASED_OUTPATIENT_CLINIC_OR_DEPARTMENT_OTHER): Payer: 59

## 2023-05-02 ENCOUNTER — Other Ambulatory Visit: Payer: Self-pay

## 2023-05-02 ENCOUNTER — Emergency Department (HOSPITAL_BASED_OUTPATIENT_CLINIC_OR_DEPARTMENT_OTHER)
Admission: EM | Admit: 2023-05-02 | Discharge: 2023-05-02 | Disposition: A | Discharge status: Home / Self Care | Payer: 59 | Attending: Emergency Medicine | Admitting: Emergency Medicine

## 2023-05-02 ENCOUNTER — Encounter (HOSPITAL_BASED_OUTPATIENT_CLINIC_OR_DEPARTMENT_OTHER): Payer: Self-pay | Admitting: Emergency Medicine

## 2023-05-02 DIAGNOSIS — Z20822 Contact with and (suspected) exposure to covid-19: Secondary | ICD-10-CM | POA: Diagnosis not present

## 2023-05-02 DIAGNOSIS — R0602 Shortness of breath: Secondary | ICD-10-CM

## 2023-05-02 DIAGNOSIS — J45901 Unspecified asthma with (acute) exacerbation: Secondary | ICD-10-CM | POA: Insufficient documentation

## 2023-05-02 DIAGNOSIS — Z87891 Personal history of nicotine dependence: Secondary | ICD-10-CM | POA: Insufficient documentation

## 2023-05-02 LAB — CBC
HCT: 46.6 % (ref 39.0–52.0)
Hemoglobin: 15.3 g/dL (ref 13.0–17.0)
MCH: 30.6 pg (ref 26.0–34.0)
MCHC: 32.8 g/dL (ref 30.0–36.0)
MCV: 93.2 fL (ref 80.0–100.0)
Platelets: 180 10*3/uL (ref 150–400)
RBC: 5 MIL/uL (ref 4.22–5.81)
RDW: 12.9 % (ref 11.5–15.5)
WBC: 4.8 10*3/uL (ref 4.0–10.5)
nRBC: 0 % (ref 0.0–0.2)

## 2023-05-02 LAB — BASIC METABOLIC PANEL
Anion gap: 8 (ref 5–15)
BUN: 14 mg/dL (ref 6–20)
CO2: 29 mmol/L (ref 22–32)
Calcium: 9 mg/dL (ref 8.9–10.3)
Chloride: 101 mmol/L (ref 98–111)
Creatinine, Ser: 0.99 mg/dL (ref 0.61–1.24)
GFR, Estimated: >60 mL/min (ref >60)
Glucose, Bld: 101 mg/dL — ABNORMAL HIGH (ref 70–99)
Potassium: 4.9 mmol/L (ref 3.5–5.1)
Sodium: 138 mmol/L (ref 135–145)

## 2023-05-02 LAB — TROPONIN I (HIGH SENSITIVITY): Troponin I (High Sensitivity): 4 ng/L (ref <18)

## 2023-05-02 LAB — SARS CORONAVIRUS 2 BY RT PCR: SARS Coronavirus 2 by RT PCR: NEGATIVE

## 2023-05-02 MED ORDER — PREDNISONE 20 MG PO TABS
40.0000 mg | ORAL_TABLET | Freq: Every day | ORAL | 0 refills | Status: AC
  Filled 2023-05-07: qty 10, 5d supply, fill #0

## 2023-05-02 MED ORDER — IPRATROPIUM-ALBUTEROL 0.5-2.5 (3) MG/3ML IN SOLN
3.0000 mL | Freq: Once | RESPIRATORY_TRACT | Status: AC
  Administered 2023-05-02: 3 mL via RESPIRATORY_TRACT
  Filled 2023-05-02: qty 3

## 2023-05-02 MED ORDER — PREDNISONE 20 MG PO TABS
40.0000 mg | ORAL_TABLET | Freq: Every day | ORAL | 0 refills | Status: DC
  Filled 2023-05-02: qty 10, 5d supply, fill #0

## 2023-05-02 MED ORDER — ALBUTEROL SULFATE (2.5 MG/3ML) 0.083% IN NEBU: 2.5000 mg | INHALATION_SOLUTION | Freq: Once | RESPIRATORY_TRACT | Status: AC

## 2023-05-02 MED ORDER — ALBUTEROL SULFATE (2.5 MG/3ML) 0.083% IN NEBU
INHALATION_SOLUTION | RESPIRATORY_TRACT | Status: AC
  Administered 2023-05-02: 2.5 mg via RESPIRATORY_TRACT
  Filled 2023-05-02: qty 3

## 2023-05-02 MED ORDER — PREDNISONE 50 MG PO TABS
60.0000 mg | ORAL_TABLET | Freq: Once | ORAL | Status: AC
  Administered 2023-05-02: 60 mg via ORAL
  Filled 2023-05-02: qty 1

## 2023-05-02 MED ORDER — ALBUTEROL SULFATE HFA 108 (90 BASE) MCG/ACT IN AERS
1.0000 | INHALATION_SPRAY | RESPIRATORY_TRACT | Status: DC | PRN
  Administered 2023-05-02: 2 via RESPIRATORY_TRACT
  Filled 2023-05-02 (×2): qty 6.7

## 2023-05-02 NOTE — ED Provider Notes (Signed)
EMERGENCY DEPARTMENT AT [facility] Provider Note   CSN: [id number] Arrival date & time: 05/02/23  1107     History  Chief Complaint  Patient presents with   Shortness of Breath    Benjamin [name] is a 42 y.o. male.   Shortness of Breath   42 year old male presents emergency department with complaints of cough, chest tightness, shortness of breath for the past 3 days.  Reports history of asthma and has been using his at home ProAir as well as albuterol inhaler with some relief of symptoms.  States that symptoms have persisted prompting visit to the emergency department.  States that symptoms feel similar to prior asthma exacerbations.  Denies any fever, chills, abdominal pain, nausea, vomiting.  Describes cough as productive in nature coughing up yellow/greenish sputum.  Past medical history significant for asthma, bronchitis.  Home Medications Prior to Admission medications   Medication Sig Start Date End Date Taking? Authorizing Provider  predniSONE (DELTASONE) 20 MG tablet Take 2 tablets (40 mg total) by mouth daily with breakfast for 5 days. 05/03/23 05/08/23 Yes Sherian Maroon A, PA  albuterol (VENTOLIN HFA) 108 (90 Base) MCG/ACT inhaler Inhale 1-2 puffs into the lungs every 6 (six) hours as needed for wheezing or shortness of breath. 08/15/22   Prosperi, Christian H, PA-C  lidocaine (XYLOCAINE) 2 % solution Use as directed 20 mLs in the mouth or throat as needed for mouth pain. 12/28/17   Rise Mu, PA-C      Allergies    Other    Review of Systems   Review of Systems  Respiratory:  Positive for shortness of breath.   All other systems reviewed and are negative.   Physical Exam Updated Vital Signs BP 112/73   Pulse 74   Temp 97.9 F (36.6 C) (Oral)   Resp 11   Ht 5\' 3"  (1.6 m)   Wt 98.9 kg   SpO2 95%   BMI 38.62 kg/m  Physical Exam Vitals and nursing note reviewed.  Constitutional:      General: He is not in acute  distress.    Appearance: He is well-developed.  HENT:     Head: Normocephalic and atraumatic.  Eyes:     Conjunctiva/sclera: Conjunctivae normal.  Cardiovascular:     Rate and Rhythm: Normal rate and regular rhythm.     Heart sounds: No murmur heard. Pulmonary:     Effort: Pulmonary effort is normal. No respiratory distress.     Breath sounds: Wheezing and rhonchi present.     Comments: Diffuse wheeze/rhonchi auscultated bilateral lower lung fields. Abdominal:     Palpations: Abdomen is soft.     Tenderness: There is no abdominal tenderness.  Musculoskeletal:        General: No swelling.     Cervical back: Neck supple.     Right lower leg: No edema.     Left lower leg: No edema.  Skin:    General: Skin is warm and dry.     Capillary Refill: Capillary refill takes less than 2 seconds.  Neurological:     Mental Status: He is alert.  Psychiatric:        Mood and Affect: Mood normal.     ED Results / Procedures / Treatments   Labs (all labs ordered are listed, but only abnormal results are displayed) Labs Reviewed  BASIC METABOLIC PANEL - Abnormal; Notable for the following components:      Result Value   Glucose, Bld 101 (*)  All other components within normal limits  SARS CORONAVIRUS 2 BY RT PCR  CBC  TROPONIN I (HIGH SENSITIVITY)    EKG EKG Interpretation Date/Time:  Saturday May 02 2023 11:30:54 EDT Ventricular Rate:  85 PR Interval:  143 QRS Duration:  97 QT Interval:  346 QTC Calculation: 412 R Axis:   67  Text Interpretation: Sinus rhythm no change from prior Confirmed by Virgina Norfolk [phone]) on 05/02/2023 11:43:53 AM  Radiology DG Chest Port 1 View  Result Date: 05/02/2023 CLINICAL DATA:  Shortness of breath.  History of asthma. EXAM: PORTABLE CHEST 1 VIEW COMPARISON:  Chest radiographs 08/15/2022 FINDINGS: The cardiomediastinal silhouette is unchanged with normal heart size. No airspace consolidation, edema, sizable pleural effusion, or pneumothorax  is identified. No acute osseous abnormality is seen. IMPRESSION: No active disease. Electronically Signed   By: Sebastian Ache M.D.   On: 05/02/2023 11:54    Procedures Procedures    Medications Ordered in ED Medications  albuterol (VENTOLIN HFA) 108 (90 Base) MCG/ACT inhaler 1-2 puff (2 puffs Inhalation Provided for home use 05/02/23 1303)  albuterol (PROVENTIL) (2.5 MG/3ML) 0.083% nebulizer solution 2.5 mg (0 mg Nebulization Return to [facility] 05/02/23 1126)  ipratropium-albuterol (DUONEB) 0.5-2.5 (3) MG/3ML nebulizer solution 3 mL (3 mLs Nebulization Given 05/02/23 1147)  albuterol (PROVENTIL) (2.5 MG/3ML) 0.083% nebulizer solution 2.5 mg (2.5 mg Nebulization Not Given 05/02/23 1150)  predniSONE (DELTASONE) tablet 60 mg (60 mg Oral Given 05/02/23 1208)    ED Course/ Medical Decision Making/ A&P Clinical Course as of 05/02/23 1304  Sat May 02, 2023  1302 Reevaluation of the patient showed significant improvement of symptoms.  Wheeze/rhonchi faintly appreciable on exam with significant improvement after breathing treatments and prednisone.  Expected discharge. [CR]    Clinical Course User Index [CR] Peter Garter, PA                             Medical Decision Making Amount and/or Complexity of Data Reviewed Labs: ordered. Radiology: ordered.  Risk Prescription drug management.   This patient presents to the ED for concern of shortness of breath, this involves an extensive number of treatment options, and is a complaint that carries with it a high risk of complications and morbidity.  The differential diagnosis includes The causes for shortness of breath include but are not limited to Cardiac (AHF, pericardial effusion and tamponade, arrhythmias, ischemia, etc) Respiratory (COPD, asthma, pneumonia, pneumothorax, primary pulmonary hypertension, PE/VQ mismatch) Hematological (anemia)  Co morbidities that complicate the patient evaluation  See HPI   Additional history  obtained:  Additional history obtained from EMR External records from outside source obtained and reviewed including hospital records   Lab Tests:  I Ordered, and personally interpreted labs.  The pertinent results include: No leukocytosis.  No evidence of anemia.  Platelets within range.  Nausea abnormalities.  No renal dysfunction.  Troponin of 4.  COVID-negative.   Imaging Studies ordered:  I ordered imaging studies including chest x-ray I independently visualized and interpreted imaging which showed no acute cardiopulmonary abnormalities I agree with the radiologist interpretation   Cardiac Monitoring: / EKG:  The patient was maintained on a cardiac monitor.  I personally viewed and interpreted the cardiac monitored which showed an underlying rhythm of: Sinus rhythm without evidence of acute ischemic change   Consultations Obtained:  N/a   Problem List / ED Course / Critical interventions / Medication management  Shortness of breath, asthma exacerbation I  ordered medication including DuoNeb, butyryl, prednisone   Reevaluation of the patient after these medicines showed that the patient improved I have reviewed the patients home medicines and have made adjustments as needed   Social Determinants of Health:  Former cigarette use.  Currently uses e-cigarettes.  Denies illicit substance use   Test / Admission - Considered:  Shortness of breath, asthma exacerbation Vitals signs  within normal range and stable throughout visit. Laboratory/imaging studies significant for: See above 42 year old male presents emergency department with complaints of 3-day history of shortness of breath and worsening wheeze.  Patient on initial exam with diffuse wheeze/rhonchi auscultated bilateral lung fields.  Patient with negative troponin with lack of acute ischemic changes on EKG from prior EKGs performed.  Low suspicion for ACS given duration of patient's symptoms with negative initial  troponin.  Patient heart score 0-3.  Patient without tachycardia, hypoxia, risk factors for DVT/PE.  Wells PE score of 0 and PERC negative so low suspicion for PE.  Patient without evidence of pneumonia, pneumothorax on chest x-ray imaging.  Symptoms most likely secondary to asthma exacerbation.  Patient noted significant improvement of symptoms with administration of medications while in the emergency department.  Will discharge home with refill of albuterol inhaler as well as prednisone for the next several days.  Close follow-up with primary care recommended for reevaluation of symptoms.  Patient overall well-appearing, afebrile, in no acute respiratory distress.  Treatment plan discussed at length with patient and he acknowledged understanding was agreeable to said plan. Worrisome signs and symptoms were discussed with the patient, and the patient acknowledged understanding to return to the ED if noticed. Patient was stable upon discharge.          Final Clinical Impression(s) / ED Diagnoses Final diagnoses:  Exacerbation of asthma, unspecified asthma severity, unspecified whether persistent  SOB (shortness of breath)    Rx / DC Orders ED Discharge Orders          Ordered    predniSONE (DELTASONE) 20 MG tablet  Daily with breakfast        05/02/23 1247              Peter Garter, [place] 05/02/23 1304    Virgina Norfolk, DO 05/02/23 1432

## 2023-05-02 NOTE — Discharge Instructions (Addendum)
As discussed, workup today overall reassuring.  No evidence of pneumonia on chest x-ray.  Heart enzymes normal.  EKG looks normal.  I suspect your symptoms are most likely secondary to asthma attack.  Will recommend continued use of breathing treatments at home as well as additionally prednisone for treatment of your asthma exacerbation.  Recommend follow-up with primary care for reassessment of your symptoms.  Please do not hesitate to return to emergency department if the worrisome signs and symptoms we discussed become apparent.

## 2023-05-02 NOTE — ED Triage Notes (Signed)
Pt states he has allergies and having issues with his asthma for several days.  Pt does have inhalers but they aren't working.

## 2023-05-02 NOTE — ED Notes (Addendum)
RT Note:  Patient given an Albuterol inhaler for home use

## 2023-05-03 ENCOUNTER — Other Ambulatory Visit (HOSPITAL_BASED_OUTPATIENT_CLINIC_OR_DEPARTMENT_OTHER): Payer: Self-pay

## 2023-05-04 ENCOUNTER — Other Ambulatory Visit (HOSPITAL_BASED_OUTPATIENT_CLINIC_OR_DEPARTMENT_OTHER): Payer: Self-pay

## 2023-05-07 ENCOUNTER — Other Ambulatory Visit (HOSPITAL_BASED_OUTPATIENT_CLINIC_OR_DEPARTMENT_OTHER): Payer: Self-pay

## 2023-05-08 ENCOUNTER — Other Ambulatory Visit (HOSPITAL_BASED_OUTPATIENT_CLINIC_OR_DEPARTMENT_OTHER): Payer: Self-pay

## 2023-05-08 ENCOUNTER — Encounter (HOSPITAL_BASED_OUTPATIENT_CLINIC_OR_DEPARTMENT_OTHER): Payer: Self-pay

## 2023-05-08 ENCOUNTER — Other Ambulatory Visit: Payer: Self-pay

## 2023-05-08 ENCOUNTER — Emergency Department (HOSPITAL_BASED_OUTPATIENT_CLINIC_OR_DEPARTMENT_OTHER)
Admission: EM | Admit: 2023-05-08 | Discharge: 2023-05-08 | Disposition: A | Discharge status: Home / Self Care | Payer: 59 | Attending: Emergency Medicine | Admitting: Emergency Medicine

## 2023-05-08 DIAGNOSIS — H1013 Acute atopic conjunctivitis, bilateral: Secondary | ICD-10-CM | POA: Diagnosis not present

## 2023-05-08 DIAGNOSIS — J45909 Unspecified asthma, uncomplicated: Secondary | ICD-10-CM | POA: Insufficient documentation

## 2023-05-08 DIAGNOSIS — H5789 Other specified disorders of eye and adnexa: Secondary | ICD-10-CM | POA: Diagnosis present

## 2023-05-08 MED ORDER — OLOPATADINE HCL 0.1 % OP SOLN
1.0000 [drp] | Freq: Two times a day (BID) | OPHTHALMIC | 1 refills | Status: AC
  Filled 2023-05-08: qty 5, 30d supply, fill #0

## 2023-05-08 NOTE — Discharge Instructions (Signed)
Please trial the antihistamine eyedrops over the next week and if you are not improving, call the eye specialist for follow-up.  You may also use over-the-counter antihistamines as directed on the packaging.

## 2023-05-08 NOTE — ED Provider Notes (Signed)
[facility] Provider Note   CSN: [id number] Arrival date & time: 05/08/23  1606     History  Chief Complaint  Patient presents with   Eye Drainage    Benjamin [name] is a 42 y.o. male.  Patient presents to the emergency department today for evaluation of bilateral eye itching and tearing over the past 1 month.  He denies injuries or foreign bodies.  No history of eye surgeries or corrective lens use.  He does have a history of allergies and asthma.  He was recently seen in the emergency department for asthma exacerbation and treated with prednisone.  He has had blurry vision but no loss of vision.       Home Medications Prior to Admission medications   Medication Sig Start Date End Date Taking? Authorizing Provider  albuterol (VENTOLIN HFA) 108 (90 Base) MCG/ACT inhaler Inhale 1-2 puffs into the lungs every 6 (six) hours as needed for wheezing or shortness of breath. 08/15/22   Prosperi, Christian H, PA-C  lidocaine (XYLOCAINE) 2 % solution Use as directed 20 mLs in the mouth or throat as needed for mouth pain. 12/28/17   Rise Mu, PA-C  olopatadine (PATANOL) 0.1 % ophthalmic solution Place 1 drop into both eyes 2 (two) times daily. 05/08/23  Yes Renne Crigler, PA-C  predniSONE (DELTASONE) 20 MG tablet Take 2 tablets (40 mg total) by mouth daily with breakfast for 5 days. 05/03/23 05/12/23  Peter Garter, PA      Allergies    Other    Review of Systems   Review of Systems  Physical Exam Updated Vital Signs BP (!) 154/93 (BP Location: Left Arm)   Pulse 90   Temp 98.2 F (36.8 C) (Oral)   Resp 16   Ht 5\' 3"  (1.6 m)   Wt 98 kg   SpO2 94%   BMI 38.27 kg/m  Physical Exam Vitals and nursing note reviewed.  Constitutional:      Appearance: He is well-developed.  HENT:     Head: Normocephalic and atraumatic.     Right Ear: Tympanic membrane, ear canal and external ear normal.     Left Ear: Tympanic  membrane, ear canal and external ear normal.     Nose: Nose normal.     Mouth/Throat:     Mouth: Mucous membranes are moist.  Eyes:     General: Allergic shiner present.        Right eye: No foreign body or discharge.        Left eye: No foreign body or discharge.     Conjunctiva/sclera:     Right eye: Right conjunctiva is injected. No chemosis, exudate or hemorrhage.    Left eye: Left conjunctiva is injected. No chemosis, exudate or hemorrhage.    Comments: Bilateral conjunctivitis, appears allergic in nature.  There is some epithelialization over the superior portion of the right cornea noted.  Pupils are PERRL.  No proptosis.  No tenderness or signs of infection around the eye to suggest periorbital cellulitis.  Pulmonary:     Effort: No respiratory distress.  Musculoskeletal:     Cervical back: Normal range of motion and neck supple.  Skin:    General: Skin is warm and dry.  Neurological:     Mental Status: He is alert.     ED Results / Procedures / Treatments   Labs (all labs ordered are listed, but only abnormal results are displayed) Labs Reviewed - No data  to display  EKG None  Radiology No results found.  Procedures Procedures    Medications Ordered in ED Medications - No data to display  ED Course/ Medical Decision Making/ A&P    Patient seen and examined. History obtained directly from patient.   Labs/EKG: None ordered  Imaging: None ordered  Medications/Fluids: None ordered  Most recent vital signs reviewed and are as follows: BP (!) 154/93 (BP Location: Left Arm)   Pulse 90   Temp 98.2 F (36.8 C) (Oral)   Resp 16   Ht 5\' 3"  (1.6 m)   Wt 98 kg   SpO2 94%   BMI 38.27 kg/m   Initial impression: Allergic conjunctivitis  Home treatment plan: Will trial antihistamine eyedrops  Return instructions discussed with patient: Vision change or loss, fever, new or worsening symptoms  Follow-up instructions discussed with patient: Follow-up with  ophthalmology in 1 week if not improving.  Encourage PCP follow-up when able.                            Medical Decision Making  General conjunctival inflammation.  No foreign bodies noted. No surrounding erythema, swelling, vision changes/loss suspicious for orbital or periorbital cellulitis. No signs of iritis. No signs of glaucoma. No symptoms of retinal detachment. No ophthalmologic emergency suspected. Outpatient referral given in case of no improvement.          Final Clinical Impression(s) / ED Diagnoses Final diagnoses:  Allergic conjunctivitis of both eyes    Rx / DC Orders ED Discharge Orders          Ordered    olopatadine (PATANOL) 0.1 % ophthalmic solution  2 times daily        05/08/23 1733              Renne Crigler, PA-C 05/08/23 1740    Rexford Maus, DO 05/08/23 1914

## 2023-05-08 NOTE — ED Triage Notes (Signed)
Patient has redness and drainage from both eyes.
